# Patient Record
Sex: Male | Born: 1935 | Race: Black or African American | Hispanic: No
Health system: Southern US, Community
[De-identification: ages and names within clinical notes are randomized; demographics above are authoritative.]

---

## 2001-12-06 ENCOUNTER — Emergency Department (HOSPITAL_COMMUNITY): Admission: EM | Admit: 2001-12-06 | Discharge: 2001-12-06 | Payer: Self-pay | Admitting: Emergency Medicine

## 2001-12-06 ENCOUNTER — Encounter: Payer: Self-pay | Admitting: Emergency Medicine

## 2001-12-11 ENCOUNTER — Encounter: Payer: Self-pay | Admitting: Family Medicine

## 2001-12-11 ENCOUNTER — Inpatient Hospital Stay (HOSPITAL_COMMUNITY): Admission: AD | Admit: 2001-12-11 | Discharge: 2002-01-07 | Payer: Self-pay | Admitting: Family Medicine

## 2001-12-14 ENCOUNTER — Encounter: Payer: Self-pay | Admitting: Family Medicine

## 2001-12-15 ENCOUNTER — Encounter: Payer: Self-pay | Admitting: Infectious Diseases

## 2001-12-17 ENCOUNTER — Encounter: Payer: Self-pay | Admitting: Family Medicine

## 2001-12-18 ENCOUNTER — Encounter: Payer: Self-pay | Admitting: Family Medicine

## 2001-12-19 ENCOUNTER — Encounter: Payer: Self-pay | Admitting: Infectious Diseases

## 2001-12-20 ENCOUNTER — Encounter: Payer: Self-pay | Admitting: Family Medicine

## 2001-12-21 ENCOUNTER — Encounter: Payer: Self-pay | Admitting: Surgery

## 2001-12-22 ENCOUNTER — Encounter: Payer: Self-pay | Admitting: Surgery

## 2001-12-23 ENCOUNTER — Encounter: Payer: Self-pay | Admitting: Surgery

## 2001-12-24 ENCOUNTER — Encounter: Payer: Self-pay | Admitting: Surgery

## 2001-12-25 ENCOUNTER — Encounter: Payer: Self-pay | Admitting: Surgery

## 2001-12-25 ENCOUNTER — Encounter: Payer: Self-pay | Admitting: Family Medicine

## 2001-12-26 ENCOUNTER — Encounter: Payer: Self-pay | Admitting: Surgery

## 2001-12-27 ENCOUNTER — Encounter: Payer: Self-pay | Admitting: Family Medicine

## 2001-12-28 ENCOUNTER — Encounter: Payer: Self-pay | Admitting: Family Medicine

## 2001-12-29 ENCOUNTER — Encounter: Payer: Self-pay | Admitting: Family Medicine

## 2002-01-01 ENCOUNTER — Encounter: Payer: Self-pay | Admitting: Family Medicine

## 2002-01-05 ENCOUNTER — Encounter: Payer: Self-pay | Admitting: Family Medicine

## 2002-01-06 ENCOUNTER — Encounter: Payer: Self-pay | Admitting: Family Medicine

## 2002-01-26 ENCOUNTER — Encounter: Payer: Self-pay | Admitting: Surgery

## 2002-01-26 ENCOUNTER — Encounter: Admission: RE | Admit: 2002-01-26 | Discharge: 2002-01-26 | Payer: Self-pay | Admitting: Surgery

## 2005-11-28 ENCOUNTER — Encounter: Admission: RE | Admit: 2005-11-28 | Discharge: 2005-11-28 | Payer: Self-pay | Admitting: Family Medicine

## 2005-12-13 ENCOUNTER — Emergency Department (HOSPITAL_COMMUNITY): Admission: EM | Admit: 2005-12-13 | Discharge: 2005-12-13 | Payer: Self-pay | Admitting: Emergency Medicine

## 2005-12-17 ENCOUNTER — Inpatient Hospital Stay (HOSPITAL_COMMUNITY): Admission: EM | Admit: 2005-12-17 | Discharge: 2006-01-01 | Payer: Self-pay | Admitting: Emergency Medicine

## 2005-12-17 ENCOUNTER — Ambulatory Visit: Payer: Self-pay | Admitting: Infectious Diseases

## 2005-12-23 ENCOUNTER — Encounter (INDEPENDENT_AMBULATORY_CARE_PROVIDER_SITE_OTHER): Payer: Self-pay | Admitting: *Deleted

## 2005-12-24 ENCOUNTER — Encounter (INDEPENDENT_AMBULATORY_CARE_PROVIDER_SITE_OTHER): Payer: Self-pay | Admitting: *Deleted

## 2006-01-03 ENCOUNTER — Ambulatory Visit: Payer: Self-pay | Admitting: Gastroenterology

## 2006-01-03 ENCOUNTER — Emergency Department (HOSPITAL_COMMUNITY): Admission: EM | Admit: 2006-01-03 | Discharge: 2006-01-03 | Payer: Self-pay | Admitting: Emergency Medicine

## 2006-01-04 ENCOUNTER — Ambulatory Visit (HOSPITAL_COMMUNITY): Admission: RE | Admit: 2006-01-04 | Discharge: 2006-01-04 | Payer: Self-pay | Admitting: Diagnostic Radiology

## 2006-01-09 ENCOUNTER — Ambulatory Visit (HOSPITAL_COMMUNITY): Admission: RE | Admit: 2006-01-09 | Discharge: 2006-01-09 | Payer: Self-pay | Admitting: Internal Medicine

## 2006-01-16 ENCOUNTER — Ambulatory Visit (HOSPITAL_COMMUNITY): Admission: RE | Admit: 2006-01-16 | Discharge: 2006-01-16 | Payer: Self-pay | Admitting: Internal Medicine

## 2006-12-23 ENCOUNTER — Emergency Department (HOSPITAL_COMMUNITY): Admission: EM | Admit: 2006-12-23 | Discharge: 2006-12-24 | Payer: Self-pay | Admitting: Emergency Medicine

## 2007-07-28 ENCOUNTER — Emergency Department (HOSPITAL_COMMUNITY): Admission: EM | Admit: 2007-07-28 | Discharge: 2007-07-28 | Payer: Self-pay | Admitting: Pediatrics

## 2008-03-11 ENCOUNTER — Encounter: Admission: RE | Admit: 2008-03-11 | Discharge: 2008-03-11 | Payer: Self-pay | Admitting: General Surgery

## 2008-03-16 ENCOUNTER — Ambulatory Visit (HOSPITAL_BASED_OUTPATIENT_CLINIC_OR_DEPARTMENT_OTHER): Admission: RE | Admit: 2008-03-16 | Discharge: 2008-03-17 | Payer: Self-pay | Admitting: General Surgery

## 2009-06-19 ENCOUNTER — Inpatient Hospital Stay (HOSPITAL_COMMUNITY): Admission: EM | Admit: 2009-06-19 | Discharge: 2009-07-05 | Payer: Self-pay | Admitting: Emergency Medicine

## 2009-06-19 ENCOUNTER — Ambulatory Visit: Payer: Self-pay | Admitting: Cardiology

## 2009-06-20 ENCOUNTER — Encounter (INDEPENDENT_AMBULATORY_CARE_PROVIDER_SITE_OTHER): Payer: Self-pay | Admitting: Cardiology

## 2009-06-23 ENCOUNTER — Ambulatory Visit: Payer: Self-pay | Admitting: Thoracic Surgery (Cardiothoracic Vascular Surgery)

## 2009-07-05 ENCOUNTER — Inpatient Hospital Stay: Admission: RE | Admit: 2009-07-05 | Discharge: 2009-08-01 | Payer: Self-pay | Admitting: Internal Medicine

## 2009-07-06 ENCOUNTER — Encounter (INDEPENDENT_AMBULATORY_CARE_PROVIDER_SITE_OTHER): Payer: Self-pay | Admitting: Internal Medicine

## 2009-07-20 ENCOUNTER — Ambulatory Visit: Payer: Self-pay | Admitting: Internal Medicine

## 2009-07-27 ENCOUNTER — Ambulatory Visit: Payer: Self-pay | Admitting: Vascular Surgery

## 2009-07-27 ENCOUNTER — Encounter (INDEPENDENT_AMBULATORY_CARE_PROVIDER_SITE_OTHER): Payer: Self-pay | Admitting: Internal Medicine

## 2009-08-25 DEATH — deceased

## 2010-08-12 LAB — MRSA PCR SCREENING: MRSA by PCR: NEGATIVE

## 2010-08-12 LAB — COMPREHENSIVE METABOLIC PANEL
ALT: 39 U/L (ref 0–53)
AST: 101 U/L — ABNORMAL HIGH (ref 0–37)
AST: 84 U/L — ABNORMAL HIGH (ref 0–37)
Albumin: 2.4 g/dL — ABNORMAL LOW (ref 3.5–5.2)
Alkaline Phosphatase: 43 U/L (ref 39–117)
Alkaline Phosphatase: 94 U/L (ref 39–117)
Chloride: 112 mEq/L (ref 96–112)
GFR calc Af Amer: 59 mL/min — ABNORMAL LOW (ref >60)
GFR calc Af Amer: >60 mL/min (ref >60)
Glucose, Bld: 205 mg/dL — ABNORMAL HIGH (ref 70–99)
Potassium: 3.4 mEq/L — ABNORMAL LOW (ref 3.5–5.1)
Potassium: 4.2 mEq/L (ref 3.5–5.1)
Sodium: 138 mEq/L (ref 135–145)
Sodium: 138 mEq/L (ref 135–145)
Total Bilirubin: 2 mg/dL — ABNORMAL HIGH (ref 0.3–1.2)
Total Protein: 6.5 g/dL (ref 6.0–8.3)

## 2010-08-12 LAB — POCT I-STAT 4, (NA,K, GLUC, HGB,HCT)
Glucose, Bld: 111 mg/dL — ABNORMAL HIGH (ref 70–99)
Glucose, Bld: 127 mg/dL — ABNORMAL HIGH (ref 70–99)
Glucose, Bld: 135 mg/dL — ABNORMAL HIGH (ref 70–99)
Glucose, Bld: 153 mg/dL — ABNORMAL HIGH (ref 70–99)
HCT: 26 % — ABNORMAL LOW (ref 39.0–52.0)
HCT: 28 % — ABNORMAL LOW (ref 39.0–52.0)
HCT: 37 % — ABNORMAL LOW (ref 39.0–52.0)
Hemoglobin: 12.6 g/dL — ABNORMAL LOW (ref 13.0–17.0)
Hemoglobin: 8.8 g/dL — ABNORMAL LOW (ref 13.0–17.0)
Hemoglobin: 8.8 g/dL — ABNORMAL LOW (ref 13.0–17.0)
Potassium: 4 mEq/L (ref 3.5–5.1)
Potassium: 4.2 mEq/L (ref 3.5–5.1)
Potassium: 4.9 mEq/L (ref 3.5–5.1)
Sodium: 131 mEq/L — ABNORMAL LOW (ref 135–145)
Sodium: 131 mEq/L — ABNORMAL LOW (ref 135–145)
Sodium: 137 mEq/L (ref 135–145)

## 2010-08-12 LAB — DIFFERENTIAL
Basophils Relative: 0 % (ref 0–1)
Basophils Relative: 0 % (ref 0–1)
Eosinophils Absolute: 0 10*3/uL (ref 0.0–0.7)
Eosinophils Absolute: 0 10*3/uL (ref 0.0–0.7)
Eosinophils Relative: 1 % (ref 0–5)
Lymphs Abs: 1.9 10*3/uL (ref 0.7–4.0)
Neutrophils Relative %: 82 % — ABNORMAL HIGH (ref 43–77)

## 2010-08-12 LAB — POCT I-STAT 3, ART BLOOD GAS (G3+)
Acid-base deficit: 3 mmol/L — ABNORMAL HIGH (ref 0.0–2.0)
Acid-base deficit: 4 mmol/L — ABNORMAL HIGH (ref 0.0–2.0)
Acid-base deficit: 4 mmol/L — ABNORMAL HIGH (ref 0.0–2.0)
Acid-base deficit: 6 mmol/L — ABNORMAL HIGH (ref 0.0–2.0)
Bicarbonate: 19.8 mEq/L — ABNORMAL LOW (ref 20.0–24.0)
O2 Saturation: 100 %
O2 Saturation: 92 %
O2 Saturation: 96 %
TCO2: 22 mmol/L (ref 0–100)
TCO2: 23 mmol/L (ref 0–100)
pCO2 arterial: 30.8 mmHg — ABNORMAL LOW (ref 35.0–45.0)
pCO2 arterial: 33.5 mmHg — ABNORMAL LOW (ref 35.0–45.0)
pCO2 arterial: 39.5 mmHg (ref 35.0–45.0)
pCO2 arterial: 40.5 mmHg (ref 35.0–45.0)
pH, Arterial: 7.339 — ABNORMAL LOW (ref 7.350–7.450)
pH, Arterial: 7.369 (ref 7.350–7.450)
pH, Arterial: 7.4 (ref 7.350–7.450)
pO2, Arterial: 64 mmHg — ABNORMAL LOW (ref 80.0–100.0)
pO2, Arterial: 68 mmHg — ABNORMAL LOW (ref 80.0–100.0)
pO2, Arterial: 75 mmHg — ABNORMAL LOW (ref 80.0–100.0)

## 2010-08-12 LAB — CBC
HCT: 17.6 % — ABNORMAL LOW (ref 39.0–52.0)
HCT: 24.1 % — ABNORMAL LOW (ref 39.0–52.0)
HCT: 24.2 % — ABNORMAL LOW (ref 39.0–52.0)
HCT: 26.4 % — ABNORMAL LOW (ref 39.0–52.0)
HCT: 33.1 % — ABNORMAL LOW (ref 39.0–52.0)
HCT: 38.7 % — ABNORMAL LOW (ref 39.0–52.0)
HCT: 39.1 % (ref 39.0–52.0)
HCT: 40.1 % (ref 39.0–52.0)
Hemoglobin: 10.8 g/dL — ABNORMAL LOW (ref 13.0–17.0)
Hemoglobin: 12.8 g/dL — ABNORMAL LOW (ref 13.0–17.0)
Hemoglobin: 13.3 g/dL (ref 13.0–17.0)
Hemoglobin: 14.2 g/dL (ref 13.0–17.0)
Hemoglobin: 14.4 g/dL (ref 13.0–17.0)
Hemoglobin: 5.9 g/dL — CL (ref 13.0–17.0)
Hemoglobin: 8.1 g/dL — ABNORMAL LOW (ref 13.0–17.0)
Hemoglobin: 8.1 g/dL — ABNORMAL LOW (ref 13.0–17.0)
MCHC: 32.7 g/dL (ref 30.0–36.0)
MCHC: 32.8 g/dL (ref 30.0–36.0)
MCHC: 33 g/dL (ref 30.0–36.0)
MCHC: 33.3 g/dL (ref 30.0–36.0)
MCHC: 33.4 g/dL (ref 30.0–36.0)
MCHC: 33.9 g/dL (ref 30.0–36.0)
MCHC: 34 g/dL (ref 30.0–36.0)
MCHC: 34.4 g/dL (ref 30.0–36.0)
MCV: 89.8 fL (ref 78.0–100.0)
MCV: 90.3 fL (ref 78.0–100.0)
MCV: 90.4 fL (ref 78.0–100.0)
MCV: 90.9 fL (ref 78.0–100.0)
MCV: 90.9 fL (ref 78.0–100.0)
MCV: 91.7 fL (ref 78.0–100.0)
MCV: 92.2 fL (ref 78.0–100.0)
MCV: 92.4 fL (ref 78.0–100.0)
MCV: 92.5 fL (ref 78.0–100.0)
Platelets: 117 10*3/uL — ABNORMAL LOW (ref 150–400)
Platelets: 190 10*3/uL (ref 150–400)
Platelets: 196 10*3/uL (ref 150–400)
Platelets: 215 10*3/uL (ref 150–400)
Platelets: 77 10*3/uL — ABNORMAL LOW (ref 150–400)
Platelets: 82 10*3/uL — ABNORMAL LOW (ref 150–400)
Platelets: 90 10*3/uL — ABNORMAL LOW (ref 150–400)
RBC: 2.69 MIL/uL — ABNORMAL LOW (ref 4.22–5.81)
RBC: 3.58 MIL/uL — ABNORMAL LOW (ref 4.22–5.81)
RBC: 4.42 MIL/uL (ref 4.22–5.81)
RBC: 4.59 MIL/uL (ref 4.22–5.81)
RBC: 4.59 MIL/uL (ref 4.22–5.81)
RDW: 13.3 % (ref 11.5–15.5)
RDW: 13.4 % (ref 11.5–15.5)
RDW: 13.6 % (ref 11.5–15.5)
RDW: 13.7 % (ref 11.5–15.5)
RDW: 13.8 % (ref 11.5–15.5)
RDW: 14.9 % (ref 11.5–15.5)
RDW: 15.1 % (ref 11.5–15.5)
WBC: 10.8 10*3/uL — ABNORMAL HIGH (ref 4.0–10.5)
WBC: 11.8 10*3/uL — ABNORMAL HIGH (ref 4.0–10.5)
WBC: 15.1 10*3/uL — ABNORMAL HIGH (ref 4.0–10.5)
WBC: 7.1 10*3/uL (ref 4.0–10.5)
WBC: 7.4 10*3/uL (ref 4.0–10.5)
WBC: 7.5 10*3/uL (ref 4.0–10.5)
WBC: 8.5 10*3/uL (ref 4.0–10.5)
WBC: 9.2 10*3/uL (ref 4.0–10.5)

## 2010-08-12 LAB — FOLATE: Folate: 7.8 ng/mL

## 2010-08-12 LAB — GLUCOSE, CAPILLARY
Glucose-Capillary: 103 mg/dL — ABNORMAL HIGH (ref 70–99)
Glucose-Capillary: 127 mg/dL — ABNORMAL HIGH (ref 70–99)
Glucose-Capillary: 131 mg/dL — ABNORMAL HIGH (ref 70–99)
Glucose-Capillary: 136 mg/dL — ABNORMAL HIGH (ref 70–99)
Glucose-Capillary: 138 mg/dL — ABNORMAL HIGH (ref 70–99)
Glucose-Capillary: 141 mg/dL — ABNORMAL HIGH (ref 70–99)
Glucose-Capillary: 145 mg/dL — ABNORMAL HIGH (ref 70–99)
Glucose-Capillary: 155 mg/dL — ABNORMAL HIGH (ref 70–99)
Glucose-Capillary: 159 mg/dL — ABNORMAL HIGH (ref 70–99)
Glucose-Capillary: 99 mg/dL (ref 70–99)

## 2010-08-12 LAB — POCT I-STAT, CHEM 8
BUN: 24 mg/dL — ABNORMAL HIGH (ref 6–23)
Calcium, Ion: 1.57 mmol/L — ABNORMAL HIGH (ref 1.12–1.32)
Calcium, Ion: 1.61 mmol/L — ABNORMAL HIGH (ref 1.12–1.32)
Chloride: 105 mEq/L (ref 96–112)
Chloride: 111 mEq/L (ref 96–112)
Chloride: 112 mEq/L (ref 96–112)
Glucose, Bld: 109 mg/dL — ABNORMAL HIGH (ref 70–99)
Glucose, Bld: 115 mg/dL — ABNORMAL HIGH (ref 70–99)
HCT: 15 % — ABNORMAL LOW (ref 39.0–52.0)
HCT: 16 % — ABNORMAL LOW (ref 39.0–52.0)
HCT: 24 % — ABNORMAL LOW (ref 39.0–52.0)
HCT: 25 % — ABNORMAL LOW (ref 39.0–52.0)
Hemoglobin: 5.1 g/dL — CL (ref 13.0–17.0)
Hemoglobin: 5.4 g/dL — CL (ref 13.0–17.0)
Potassium: 3.4 mEq/L — ABNORMAL LOW (ref 3.5–5.1)
Potassium: 4.2 mEq/L (ref 3.5–5.1)
Sodium: 137 mEq/L (ref 135–145)

## 2010-08-12 LAB — PREPARE RBC (CROSSMATCH)

## 2010-08-12 LAB — BASIC METABOLIC PANEL
BUN: 14 mg/dL (ref 6–23)
BUN: 15 mg/dL (ref 6–23)
BUN: 16 mg/dL (ref 6–23)
BUN: 19 mg/dL (ref 6–23)
BUN: 19 mg/dL (ref 6–23)
BUN: 21 mg/dL (ref 6–23)
CO2: 21 mEq/L (ref 19–32)
CO2: 23 mEq/L (ref 19–32)
CO2: 25 mEq/L (ref 19–32)
CO2: 25 mEq/L (ref 19–32)
CO2: 26 mEq/L (ref 19–32)
Calcium: 10.1 mg/dL (ref 8.4–10.5)
Calcium: 10.9 mg/dL — ABNORMAL HIGH (ref 8.4–10.5)
Chloride: 107 mEq/L (ref 96–112)
Chloride: 107 mEq/L (ref 96–112)
Chloride: 108 mEq/L (ref 96–112)
Chloride: 109 mEq/L (ref 96–112)
Chloride: 110 mEq/L (ref 96–112)
Chloride: 113 mEq/L — ABNORMAL HIGH (ref 96–112)
Creatinine, Ser: 1.01 mg/dL (ref 0.4–1.5)
Creatinine, Ser: 1.12 mg/dL (ref 0.4–1.5)
Creatinine, Ser: 1.16 mg/dL (ref 0.4–1.5)
Creatinine, Ser: 1.2 mg/dL (ref 0.4–1.5)
Creatinine, Ser: 1.21 mg/dL (ref 0.4–1.5)
GFR calc Af Amer: >60 mL/min (ref >60)
GFR calc Af Amer: >60 mL/min (ref >60)
GFR calc non Af Amer: 49 mL/min — ABNORMAL LOW (ref >60)
GFR calc non Af Amer: 58 mL/min — ABNORMAL LOW (ref >60)
GFR calc non Af Amer: >60 mL/min (ref >60)
Glucose, Bld: 146 mg/dL — ABNORMAL HIGH (ref 70–99)
Glucose, Bld: 192 mg/dL — ABNORMAL HIGH (ref 70–99)
Glucose, Bld: 282 mg/dL — ABNORMAL HIGH (ref 70–99)
Glucose, Bld: 81 mg/dL (ref 70–99)
Potassium: 3.8 mEq/L (ref 3.5–5.1)
Potassium: 4 mEq/L (ref 3.5–5.1)
Potassium: 4 mEq/L (ref 3.5–5.1)
Sodium: 140 mEq/L (ref 135–145)

## 2010-08-12 LAB — CARDIAC PANEL(CRET KIN+CKTOT+MB+TROPI)
CK, MB: 136.3 ng/mL (ref 0.3–4.0)
CK, MB: 58 ng/mL (ref 0.3–4.0)
Relative Index: 11 — ABNORMAL HIGH (ref 0.0–2.5)
Relative Index: 15.4 — ABNORMAL HIGH (ref 0.0–2.5)
Relative Index: 15.9 — ABNORMAL HIGH (ref 0.0–2.5)
Total CK: 856 U/L — ABNORMAL HIGH (ref 7–232)
Troponin I: 30.82 ng/mL (ref 0.00–0.06)

## 2010-08-12 LAB — BLOOD GAS, ARTERIAL
Acid-base deficit: 4 mmol/L — ABNORMAL HIGH (ref 0.0–2.0)
Bicarbonate: 19.8 mEq/L — ABNORMAL LOW (ref 20.0–24.0)
Patient temperature: 98.6
TCO2: 20.8 mmol/L (ref 0–100)

## 2010-08-12 LAB — HEMOGLOBIN AND HEMATOCRIT, BLOOD
HCT: 26 % — ABNORMAL LOW (ref 39.0–52.0)
Hemoglobin: 8.6 g/dL — ABNORMAL LOW (ref 13.0–17.0)

## 2010-08-12 LAB — RETICULOCYTES
RBC.: 4.27 MIL/uL (ref 4.22–5.81)
Retic Count, Absolute: 98.2 10*3/uL (ref 19.0–186.0)
Retic Ct Pct: 2.3 % (ref 0.4–3.1)

## 2010-08-12 LAB — CROSSMATCH

## 2010-08-12 LAB — POCT CARDIAC MARKERS: Myoglobin, poc: 234 ng/mL (ref 12–200)

## 2010-08-12 LAB — APTT: aPTT: >200 seconds (ref 24–37)

## 2010-08-12 LAB — MAGNESIUM
Magnesium: 2.1 mg/dL (ref 1.5–2.5)
Magnesium: 2.2 mg/dL (ref 1.5–2.5)
Magnesium: 2.2 mg/dL (ref 1.5–2.5)
Magnesium: 2.2 mg/dL (ref 1.5–2.5)
Magnesium: 2.5 mg/dL (ref 1.5–2.5)

## 2010-08-12 LAB — TROPONIN I
Troponin I: 0.78 ng/mL (ref 0.00–0.06)
Troponin I: 4.42 ng/mL (ref 0.00–0.06)

## 2010-08-12 LAB — CREATININE, SERUM
Creatinine, Ser: 1.11 mg/dL (ref 0.4–1.5)
GFR calc non Af Amer: 57 mL/min — ABNORMAL LOW (ref >60)
GFR calc non Af Amer: >60 mL/min (ref >60)

## 2010-08-12 LAB — POCT I-STAT 3, VENOUS BLOOD GAS (G3P V)
TCO2: 21 mmol/L (ref 0–100)
pCO2, Ven: 41.4 mmHg — ABNORMAL LOW (ref 45.0–50.0)
pH, Ven: 7.297 (ref 7.250–7.300)

## 2010-08-12 LAB — PROTIME-INR: Prothrombin Time: 14.3 seconds (ref 11.6–15.2)

## 2010-08-12 LAB — CK TOTAL AND CKMB (NOT AT ARMC)
CK, MB: 21.5 ng/mL (ref 0.3–4.0)
CK, MB: 66.9 ng/mL (ref 0.3–4.0)
Total CK: 335 U/L — ABNORMAL HIGH (ref 7–232)
Total CK: 355 U/L — ABNORMAL HIGH (ref 7–232)
Total CK: 371 U/L — ABNORMAL HIGH (ref 7–232)

## 2010-08-12 LAB — PREPARE FRESH FROZEN PLASMA

## 2010-08-12 LAB — HEPARIN LEVEL (UNFRACTIONATED): Heparin Unfractionated: 0.6 IU/mL (ref 0.30–0.70)

## 2010-08-12 LAB — LIPID PANEL: HDL: 42 mg/dL (ref >39)

## 2010-08-12 LAB — CARBOXYHEMOGLOBIN: O2 Saturation: 54.4 %

## 2010-08-12 LAB — PLATELET COUNT: Platelets: 116 10*3/uL — ABNORMAL LOW (ref 150–400)

## 2010-08-12 LAB — RPR: RPR Ser Ql: NONREACTIVE

## 2010-08-12 LAB — PREPARE PLATELETS

## 2010-08-15 LAB — COMPREHENSIVE METABOLIC PANEL
ALT: 58 U/L — ABNORMAL HIGH (ref 0–53)
AST: 28 U/L (ref 0–37)
AST: 35 U/L (ref 0–37)
Albumin: 2.2 g/dL — ABNORMAL LOW (ref 3.5–5.2)
Albumin: 2.3 g/dL — ABNORMAL LOW (ref 3.5–5.2)
Alkaline Phosphatase: 101 U/L (ref 39–117)
Alkaline Phosphatase: 115 U/L (ref 39–117)
Alkaline Phosphatase: 143 U/L — ABNORMAL HIGH (ref 39–117)
BUN: 19 mg/dL (ref 6–23)
BUN: 20 mg/dL (ref 6–23)
BUN: 24 mg/dL — ABNORMAL HIGH (ref 6–23)
BUN: 29 mg/dL — ABNORMAL HIGH (ref 6–23)
CO2: 25 mEq/L (ref 19–32)
CO2: 26 mEq/L (ref 19–32)
Calcium: 10 mg/dL (ref 8.4–10.5)
Calcium: 10.3 mg/dL (ref 8.4–10.5)
Calcium: 11.8 mg/dL — ABNORMAL HIGH (ref 8.4–10.5)
Chloride: 116 mEq/L — ABNORMAL HIGH (ref 96–112)
Chloride: 120 mEq/L — ABNORMAL HIGH (ref 96–112)
Creatinine, Ser: 0.97 mg/dL (ref 0.4–1.5)
Creatinine, Ser: 1.1 mg/dL (ref 0.4–1.5)
GFR calc Af Amer: >60 mL/min (ref >60)
GFR calc Af Amer: >60 mL/min (ref >60)
GFR calc non Af Amer: >60 mL/min (ref >60)
GFR calc non Af Amer: >60 mL/min (ref >60)
Glucose, Bld: 153 mg/dL — ABNORMAL HIGH (ref 70–99)
Glucose, Bld: 157 mg/dL — ABNORMAL HIGH (ref 70–99)
Glucose, Bld: 95 mg/dL (ref 70–99)
Potassium: 3.4 mEq/L — ABNORMAL LOW (ref 3.5–5.1)
Potassium: 3.7 mEq/L (ref 3.5–5.1)
Sodium: 144 mEq/L (ref 135–145)
Total Bilirubin: 1.5 mg/dL — ABNORMAL HIGH (ref 0.3–1.2)
Total Protein: 5.9 g/dL — ABNORMAL LOW (ref 6.0–8.3)
Total Protein: 5.9 g/dL — ABNORMAL LOW (ref 6.0–8.3)
Total Protein: 5.9 g/dL — ABNORMAL LOW (ref 6.0–8.3)

## 2010-08-15 LAB — DIFFERENTIAL
Basophils Absolute: 0 10*3/uL (ref 0.0–0.1)
Basophils Absolute: 0 10*3/uL (ref 0.0–0.1)
Basophils Relative: 0 % (ref 0–1)
Basophils Relative: 0 % (ref 0–1)
Basophils Relative: 0 % (ref 0–1)
Eosinophils Absolute: 0.1 10*3/uL (ref 0.0–0.7)
Eosinophils Absolute: 0.1 10*3/uL (ref 0.0–0.7)
Eosinophils Absolute: 0.1 10*3/uL (ref 0.0–0.7)
Eosinophils Relative: 0 % (ref 0–5)
Eosinophils Relative: 1 % (ref 0–5)
Eosinophils Relative: 1 % (ref 0–5)
Eosinophils Relative: 1 % (ref 0–5)
Lymphocytes Relative: 5 % — ABNORMAL LOW (ref 12–46)
Lymphocytes Relative: 6 % — ABNORMAL LOW (ref 12–46)
Lymphocytes Relative: 7 % — ABNORMAL LOW (ref 12–46)
Lymphs Abs: 0.6 10*3/uL — ABNORMAL LOW (ref 0.7–4.0)
Lymphs Abs: 0.7 10*3/uL (ref 0.7–4.0)
Lymphs Abs: 0.8 10*3/uL (ref 0.7–4.0)
Monocytes Absolute: 0.3 10*3/uL (ref 0.1–1.0)
Monocytes Absolute: 0.5 10*3/uL (ref 0.1–1.0)
Monocytes Absolute: 0.5 10*3/uL (ref 0.1–1.0)
Monocytes Absolute: 1.2 10*3/uL — ABNORMAL HIGH (ref 0.1–1.0)
Monocytes Relative: 4 % (ref 3–12)
Monocytes Relative: 5 % (ref 3–12)
Monocytes Relative: 5 % (ref 3–12)
Neutro Abs: 11.2 10*3/uL — ABNORMAL HIGH (ref 1.7–7.7)
Neutro Abs: 12.2 10*3/uL — ABNORMAL HIGH (ref 1.7–7.7)
Neutro Abs: 17.9 10*3/uL — ABNORMAL HIGH (ref 1.7–7.7)
Neutrophils Relative %: 86 % — ABNORMAL HIGH (ref 43–77)
Neutrophils Relative %: 87 % — ABNORMAL HIGH (ref 43–77)
Neutrophils Relative %: 87 % — ABNORMAL HIGH (ref 43–77)
Neutrophils Relative %: 90 % — ABNORMAL HIGH (ref 43–77)
Neutrophils Relative %: 91 % — ABNORMAL HIGH (ref 43–77)

## 2010-08-15 LAB — URINE CULTURE

## 2010-08-15 LAB — GLUCOSE, CAPILLARY
Glucose-Capillary: 101 mg/dL — ABNORMAL HIGH (ref 70–99)
Glucose-Capillary: 102 mg/dL — ABNORMAL HIGH (ref 70–99)
Glucose-Capillary: 104 mg/dL — ABNORMAL HIGH (ref 70–99)
Glucose-Capillary: 105 mg/dL — ABNORMAL HIGH (ref 70–99)
Glucose-Capillary: 108 mg/dL — ABNORMAL HIGH (ref 70–99)
Glucose-Capillary: 113 mg/dL — ABNORMAL HIGH (ref 70–99)
Glucose-Capillary: 113 mg/dL — ABNORMAL HIGH (ref 70–99)
Glucose-Capillary: 117 mg/dL — ABNORMAL HIGH (ref 70–99)
Glucose-Capillary: 125 mg/dL — ABNORMAL HIGH (ref 70–99)
Glucose-Capillary: 128 mg/dL — ABNORMAL HIGH (ref 70–99)
Glucose-Capillary: 79 mg/dL (ref 70–99)
Glucose-Capillary: 86 mg/dL (ref 70–99)
Glucose-Capillary: 90 mg/dL (ref 70–99)
Glucose-Capillary: 91 mg/dL (ref 70–99)
Glucose-Capillary: 91 mg/dL (ref 70–99)
Glucose-Capillary: 95 mg/dL (ref 70–99)
Glucose-Capillary: 99 mg/dL (ref 70–99)

## 2010-08-15 LAB — BASIC METABOLIC PANEL
BUN: 23 mg/dL (ref 6–23)
BUN: 26 mg/dL — ABNORMAL HIGH (ref 6–23)
BUN: 28 mg/dL — ABNORMAL HIGH (ref 6–23)
BUN: 29 mg/dL — ABNORMAL HIGH (ref 6–23)
BUN: 31 mg/dL — ABNORMAL HIGH (ref 6–23)
CO2: 23 mEq/L (ref 19–32)
CO2: 24 mEq/L (ref 19–32)
CO2: 26 mEq/L (ref 19–32)
CO2: 26 mEq/L (ref 19–32)
CO2: 26 mEq/L (ref 19–32)
CO2: 26 mEq/L (ref 19–32)
CO2: 26 mEq/L (ref 19–32)
CO2: 27 mEq/L (ref 19–32)
CO2: 27 mEq/L (ref 19–32)
Calcium: 10 mg/dL (ref 8.4–10.5)
Calcium: 11 mg/dL — ABNORMAL HIGH (ref 8.4–10.5)
Calcium: 11 mg/dL — ABNORMAL HIGH (ref 8.4–10.5)
Calcium: 9.6 mg/dL (ref 8.4–10.5)
Calcium: 9.8 mg/dL (ref 8.4–10.5)
Chloride: 106 mEq/L (ref 96–112)
Chloride: 107 mEq/L (ref 96–112)
Chloride: 112 mEq/L (ref 96–112)
Chloride: 116 mEq/L — ABNORMAL HIGH (ref 96–112)
Chloride: 116 mEq/L — ABNORMAL HIGH (ref 96–112)
Chloride: 118 mEq/L — ABNORMAL HIGH (ref 96–112)
Chloride: 120 mEq/L — ABNORMAL HIGH (ref 96–112)
Chloride: 121 mEq/L — ABNORMAL HIGH (ref 96–112)
Chloride: 125 mEq/L — ABNORMAL HIGH (ref 96–112)
Creatinine, Ser: 0.84 mg/dL (ref 0.4–1.5)
Creatinine, Ser: 0.95 mg/dL (ref 0.4–1.5)
Creatinine, Ser: 1 mg/dL (ref 0.4–1.5)
Creatinine, Ser: 1.02 mg/dL (ref 0.4–1.5)
Creatinine, Ser: 1.08 mg/dL (ref 0.4–1.5)
Creatinine, Ser: 1.16 mg/dL (ref 0.4–1.5)
Creatinine, Ser: 1.22 mg/dL (ref 0.4–1.5)
Creatinine, Ser: 1.23 mg/dL (ref 0.4–1.5)
Creatinine, Ser: 1.24 mg/dL (ref 0.4–1.5)
GFR calc Af Amer: >60 mL/min (ref >60)
GFR calc Af Amer: >60 mL/min (ref >60)
GFR calc Af Amer: >60 mL/min (ref >60)
GFR calc Af Amer: >60 mL/min (ref >60)
GFR calc Af Amer: >60 mL/min (ref >60)
GFR calc Af Amer: >60 mL/min (ref >60)
GFR calc Af Amer: >60 mL/min (ref >60)
GFR calc Af Amer: >60 mL/min (ref >60)
GFR calc Af Amer: >60 mL/min (ref >60)
GFR calc Af Amer: >60 mL/min (ref >60)
GFR calc non Af Amer: 57 mL/min — ABNORMAL LOW (ref >60)
GFR calc non Af Amer: >60 mL/min (ref >60)
GFR calc non Af Amer: >60 mL/min (ref >60)
GFR calc non Af Amer: >60 mL/min (ref >60)
GFR calc non Af Amer: >60 mL/min (ref >60)
GFR calc non Af Amer: >60 mL/min (ref >60)
GFR calc non Af Amer: >60 mL/min (ref >60)
Glucose, Bld: 102 mg/dL — ABNORMAL HIGH (ref 70–99)
Glucose, Bld: 118 mg/dL — ABNORMAL HIGH (ref 70–99)
Glucose, Bld: 124 mg/dL — ABNORMAL HIGH (ref 70–99)
Glucose, Bld: 154 mg/dL — ABNORMAL HIGH (ref 70–99)
Glucose, Bld: 94 mg/dL (ref 70–99)
Potassium: 3.1 mEq/L — ABNORMAL LOW (ref 3.5–5.1)
Potassium: 3.5 mEq/L (ref 3.5–5.1)
Potassium: 3.6 mEq/L (ref 3.5–5.1)
Potassium: 3.7 mEq/L (ref 3.5–5.1)
Potassium: 3.7 mEq/L (ref 3.5–5.1)
Sodium: 136 mEq/L (ref 135–145)
Sodium: 137 mEq/L (ref 135–145)
Sodium: 142 mEq/L (ref 135–145)
Sodium: 144 mEq/L (ref 135–145)
Sodium: 145 mEq/L (ref 135–145)
Sodium: 147 mEq/L — ABNORMAL HIGH (ref 135–145)
Sodium: 149 mEq/L — ABNORMAL HIGH (ref 135–145)

## 2010-08-15 LAB — PREALBUMIN
Prealbumin: 10 mg/dL — ABNORMAL LOW (ref 18.0–45.0)
Prealbumin: 9.2 mg/dL — ABNORMAL LOW (ref 18.0–45.0)

## 2010-08-15 LAB — WOUND CULTURE: Gram Stain: NONE SEEN

## 2010-08-15 LAB — HEPARIN LEVEL (UNFRACTIONATED): Heparin Unfractionated: <0.1 IU/mL — ABNORMAL LOW (ref 0.30–0.70)

## 2010-08-15 LAB — CBC
HCT: 24.3 % — ABNORMAL LOW (ref 39.0–52.0)
HCT: 24.4 % — ABNORMAL LOW (ref 39.0–52.0)
HCT: 25.2 % — ABNORMAL LOW (ref 39.0–52.0)
HCT: 25.7 % — ABNORMAL LOW (ref 39.0–52.0)
HCT: 25.8 % — ABNORMAL LOW (ref 39.0–52.0)
HCT: 26 % — ABNORMAL LOW (ref 39.0–52.0)
HCT: 26.5 % — ABNORMAL LOW (ref 39.0–52.0)
HCT: 27.3 % — ABNORMAL LOW (ref 39.0–52.0)
HCT: 28.7 % — ABNORMAL LOW (ref 39.0–52.0)
HCT: 29.7 % — ABNORMAL LOW (ref 39.0–52.0)
HCT: 34.7 % — ABNORMAL LOW (ref 39.0–52.0)
Hemoglobin: 8.2 g/dL — ABNORMAL LOW (ref 13.0–17.0)
Hemoglobin: 8.5 g/dL — ABNORMAL LOW (ref 13.0–17.0)
Hemoglobin: 8.7 g/dL — ABNORMAL LOW (ref 13.0–17.0)
Hemoglobin: 8.8 g/dL — ABNORMAL LOW (ref 13.0–17.0)
Hemoglobin: 9 g/dL — ABNORMAL LOW (ref 13.0–17.0)
Hemoglobin: 9.1 g/dL — ABNORMAL LOW (ref 13.0–17.0)
Hemoglobin: 9.6 g/dL — ABNORMAL LOW (ref 13.0–17.0)
MCHC: 32.5 g/dL (ref 30.0–36.0)
MCHC: 32.5 g/dL (ref 30.0–36.0)
MCHC: 32.6 g/dL (ref 30.0–36.0)
MCHC: 32.6 g/dL (ref 30.0–36.0)
MCHC: 32.7 g/dL (ref 30.0–36.0)
MCHC: 33 g/dL (ref 30.0–36.0)
MCHC: 33.1 g/dL (ref 30.0–36.0)
MCHC: 33.2 g/dL (ref 30.0–36.0)
MCHC: 33.4 g/dL (ref 30.0–36.0)
MCHC: 33.5 g/dL (ref 30.0–36.0)
MCHC: 33.6 g/dL (ref 30.0–36.0)
MCHC: 33.9 g/dL (ref 30.0–36.0)
MCV: 89.7 fL (ref 78.0–100.0)
MCV: 89.7 fL (ref 78.0–100.0)
MCV: 90.9 fL (ref 78.0–100.0)
MCV: 91.4 fL (ref 78.0–100.0)
MCV: 91.4 fL (ref 78.0–100.0)
MCV: 92.1 fL (ref 78.0–100.0)
MCV: 92.4 fL (ref 78.0–100.0)
MCV: 93.6 fL (ref 78.0–100.0)
MCV: 93.8 fL (ref 78.0–100.0)
MCV: 95.3 fL (ref 78.0–100.0)
Platelets: 116 10*3/uL — ABNORMAL LOW (ref 150–400)
Platelets: 129 10*3/uL — ABNORMAL LOW (ref 150–400)
Platelets: 133 10*3/uL — ABNORMAL LOW (ref 150–400)
Platelets: 162 10*3/uL (ref 150–400)
Platelets: 165 10*3/uL (ref 150–400)
Platelets: 176 10*3/uL (ref 150–400)
Platelets: 177 10*3/uL (ref 150–400)
Platelets: 187 10*3/uL (ref 150–400)
Platelets: 196 10*3/uL (ref 150–400)
RBC: 2.67 MIL/uL — ABNORMAL LOW (ref 4.22–5.81)
RBC: 2.72 MIL/uL — ABNORMAL LOW (ref 4.22–5.81)
RBC: 2.77 MIL/uL — ABNORMAL LOW (ref 4.22–5.81)
RBC: 2.82 MIL/uL — ABNORMAL LOW (ref 4.22–5.81)
RBC: 2.91 MIL/uL — ABNORMAL LOW (ref 4.22–5.81)
RBC: 2.94 MIL/uL — ABNORMAL LOW (ref 4.22–5.81)
RBC: 2.96 MIL/uL — ABNORMAL LOW (ref 4.22–5.81)
RBC: 2.98 MIL/uL — ABNORMAL LOW (ref 4.22–5.81)
RBC: 2.99 MIL/uL — ABNORMAL LOW (ref 4.22–5.81)
RBC: 3.06 MIL/uL — ABNORMAL LOW (ref 4.22–5.81)
RBC: 3.12 MIL/uL — ABNORMAL LOW (ref 4.22–5.81)
RDW: 14.9 % (ref 11.5–15.5)
RDW: 15.6 % — ABNORMAL HIGH (ref 11.5–15.5)
RDW: 15.7 % — ABNORMAL HIGH (ref 11.5–15.5)
RDW: 16.4 % — ABNORMAL HIGH (ref 11.5–15.5)
RDW: 21 % — ABNORMAL HIGH (ref 11.5–15.5)
RDW: 21.1 % — ABNORMAL HIGH (ref 11.5–15.5)
RDW: 21.4 % — ABNORMAL HIGH (ref 11.5–15.5)
WBC: 12.2 10*3/uL — ABNORMAL HIGH (ref 4.0–10.5)
WBC: 12.6 10*3/uL — ABNORMAL HIGH (ref 4.0–10.5)
WBC: 19.9 10*3/uL — ABNORMAL HIGH (ref 4.0–10.5)
WBC: 20 10*3/uL — ABNORMAL HIGH (ref 4.0–10.5)
WBC: 20.2 10*3/uL — ABNORMAL HIGH (ref 4.0–10.5)
WBC: 5.9 10*3/uL (ref 4.0–10.5)
WBC: 7.7 10*3/uL (ref 4.0–10.5)
WBC: 8.2 10*3/uL (ref 4.0–10.5)

## 2010-08-15 LAB — PROTIME-INR
INR: 1.14 (ref 0.00–1.49)
INR: 1.22 (ref 0.00–1.49)
Prothrombin Time: 14.5 seconds (ref 11.6–15.2)
Prothrombin Time: 15.3 seconds — ABNORMAL HIGH (ref 11.6–15.2)

## 2010-08-15 LAB — CATH TIP CULTURE: Culture: 6

## 2010-08-15 LAB — PHOSPHORUS
Phosphorus: 2.2 mg/dL — ABNORMAL LOW (ref 2.3–4.6)
Phosphorus: 3 mg/dL (ref 2.3–4.6)

## 2010-08-15 LAB — CULTURE, BLOOD (ROUTINE X 2): Culture: NO GROWTH

## 2010-08-15 LAB — POCT I-STAT, CHEM 8
Calcium, Ion: 1.6 mmol/L — ABNORMAL HIGH (ref 1.12–1.32)
Chloride: 115 mEq/L — ABNORMAL HIGH (ref 96–112)
Creatinine, Ser: 1.3 mg/dL (ref 0.4–1.5)
Glucose, Bld: 231 mg/dL — ABNORMAL HIGH (ref 70–99)
HCT: 26 % — ABNORMAL LOW (ref 39.0–52.0)

## 2010-08-15 LAB — URINE MICROSCOPIC-ADD ON

## 2010-08-15 LAB — POCT I-STAT 3, ART BLOOD GAS (G3+)
Acid-Base Excess: 3 mmol/L — ABNORMAL HIGH (ref 0.0–2.0)
O2 Saturation: 98 %
Patient temperature: 37.8
TCO2: 27 mmol/L (ref 0–100)
pCO2 arterial: 32.3 mmHg — ABNORMAL LOW (ref 35.0–45.0)

## 2010-08-15 LAB — LIPID PANEL
HDL: 26 mg/dL — ABNORMAL LOW (ref >39)
LDL Cholesterol: 31 mg/dL (ref 0–99)
Total CHOL/HDL Ratio: 2.8 RATIO
VLDL: 16 mg/dL (ref 0–40)

## 2010-08-15 LAB — URINALYSIS, ROUTINE W REFLEX MICROSCOPIC
Glucose, UA: NEGATIVE mg/dL
Ketones, ur: 15 mg/dL — AB
Ketones, ur: NEGATIVE mg/dL
Nitrite: POSITIVE — AB
Protein, ur: 100 mg/dL — AB
Protein, ur: NEGATIVE mg/dL
pH: 6.5 (ref 5.0–8.0)

## 2010-08-15 LAB — HEMOGLOBIN A1C: Mean Plasma Glucose: 114 mg/dL

## 2010-08-15 LAB — MAGNESIUM
Magnesium: 2.2 mg/dL (ref 1.5–2.5)
Magnesium: 2.4 mg/dL (ref 1.5–2.5)
Magnesium: 2.5 mg/dL (ref 1.5–2.5)
Magnesium: 2.6 mg/dL — ABNORMAL HIGH (ref 1.5–2.5)

## 2010-08-15 LAB — HEMOGLOBIN AND HEMATOCRIT, BLOOD
HCT: 22.6 % — ABNORMAL LOW (ref 39.0–52.0)
HCT: 25.5 % — ABNORMAL LOW (ref 39.0–52.0)
HCT: 27.6 % — ABNORMAL LOW (ref 39.0–52.0)
Hemoglobin: 7.3 g/dL — ABNORMAL LOW (ref 13.0–17.0)
Hemoglobin: 7.6 g/dL — ABNORMAL LOW (ref 13.0–17.0)
Hemoglobin: 8.2 g/dL — ABNORMAL LOW (ref 13.0–17.0)
Hemoglobin: 8.3 g/dL — ABNORMAL LOW (ref 13.0–17.0)

## 2010-08-15 LAB — VITAMIN B12: Vitamin B-12: 650 pg/mL (ref 211–911)

## 2010-08-15 LAB — CROSSMATCH
ABO/RH(D): A NEG
Antibody Screen: NEGATIVE

## 2010-08-15 LAB — HEPARIN INDUCED THROMBOCYTOPENIA PNL: Patient O.D.: 2.016

## 2010-08-15 LAB — VANCOMYCIN, RANDOM
Vancomycin Rm: 19.5 ug/mL
Vancomycin Rm: 36.8 ug/mL

## 2010-08-15 LAB — VANCOMYCIN, TROUGH: Vancomycin Tr: 16 ug/mL (ref 10.0–20.0)

## 2010-08-19 LAB — DIFFERENTIAL
Basophils Absolute: 0 10*3/uL (ref 0.0–0.1)
Basophils Absolute: 0 10*3/uL (ref 0.0–0.1)
Basophils Absolute: 0 10*3/uL (ref 0.0–0.1)
Basophils Relative: 0 % (ref 0–1)
Basophils Relative: 0 % (ref 0–1)
Eosinophils Absolute: 0 10*3/uL (ref 0.0–0.7)
Eosinophils Absolute: 0 10*3/uL (ref 0.0–0.7)
Eosinophils Absolute: 0 10*3/uL (ref 0.0–0.7)
Eosinophils Relative: 0 % (ref 0–5)
Lymphocytes Relative: 3 % — ABNORMAL LOW (ref 12–46)
Lymphocytes Relative: 6 % — ABNORMAL LOW (ref 12–46)
Lymphs Abs: 0.3 10*3/uL — ABNORMAL LOW (ref 0.7–4.0)
Lymphs Abs: 0.5 10*3/uL — ABNORMAL LOW (ref 0.7–4.0)
Monocytes Absolute: 0.7 10*3/uL (ref 0.1–1.0)
Monocytes Relative: 0 % — ABNORMAL LOW (ref 3–12)
Monocytes Relative: 3 % (ref 3–12)
Monocytes Relative: 3 % (ref 3–12)
Neutro Abs: 10.8 10*3/uL — ABNORMAL HIGH (ref 1.7–7.7)
Neutro Abs: 9.7 10*3/uL — ABNORMAL HIGH (ref 1.7–7.7)
Neutrophils Relative %: 94 % — ABNORMAL HIGH (ref 43–77)
Neutrophils Relative %: 94 % — ABNORMAL HIGH (ref 43–77)
WBC Morphology: INCREASED

## 2010-08-19 LAB — COMPREHENSIVE METABOLIC PANEL
ALT: 40 U/L (ref 0–53)
ALT: 55 U/L — ABNORMAL HIGH (ref 0–53)
AST: 43 U/L — ABNORMAL HIGH (ref 0–37)
Alkaline Phosphatase: 97 U/L (ref 39–117)
BUN: 24 mg/dL — ABNORMAL HIGH (ref 6–23)
CO2: 21 mEq/L (ref 19–32)
CO2: 27 mEq/L (ref 19–32)
Calcium: 10.5 mg/dL (ref 8.4–10.5)
Chloride: 119 mEq/L — ABNORMAL HIGH (ref 96–112)
Creatinine, Ser: 1.37 mg/dL (ref 0.4–1.5)
GFR calc Af Amer: >60 mL/min (ref >60)
GFR calc non Af Amer: 50 mL/min — ABNORMAL LOW (ref >60)
Glucose, Bld: 100 mg/dL — ABNORMAL HIGH (ref 70–99)
Glucose, Bld: 124 mg/dL — ABNORMAL HIGH (ref 70–99)
Potassium: 3.1 mEq/L — ABNORMAL LOW (ref 3.5–5.1)
Sodium: 150 mEq/L — ABNORMAL HIGH (ref 135–145)
Sodium: 158 mEq/L — ABNORMAL HIGH (ref 135–145)
Total Bilirubin: 0.8 mg/dL (ref 0.3–1.2)
Total Protein: 4.8 g/dL — ABNORMAL LOW (ref 6.0–8.3)

## 2010-08-19 LAB — URINALYSIS, ROUTINE W REFLEX MICROSCOPIC
Bilirubin Urine: NEGATIVE
Ketones, ur: NEGATIVE mg/dL
Ketones, ur: NEGATIVE mg/dL
Nitrite: NEGATIVE
Nitrite: POSITIVE — AB
Specific Gravity, Urine: 1.015 (ref 1.005–1.030)
Specific Gravity, Urine: 1.018 (ref 1.005–1.030)
Urobilinogen, UA: 1 mg/dL (ref 0.0–1.0)
Urobilinogen, UA: 1 mg/dL (ref 0.0–1.0)
pH: 6.5 (ref 5.0–8.0)

## 2010-08-19 LAB — CROSSMATCH: Antibody Screen: NEGATIVE

## 2010-08-19 LAB — CBC
HCT: 24 % — ABNORMAL LOW (ref 39.0–52.0)
HCT: 25.1 % — ABNORMAL LOW (ref 39.0–52.0)
HCT: 25.8 % — ABNORMAL LOW (ref 39.0–52.0)
HCT: 26.8 % — ABNORMAL LOW (ref 39.0–52.0)
HCT: 27 % — ABNORMAL LOW (ref 39.0–52.0)
Hemoglobin: 7.7 g/dL — ABNORMAL LOW (ref 13.0–17.0)
Hemoglobin: 8.1 g/dL — ABNORMAL LOW (ref 13.0–17.0)
Hemoglobin: 8.1 g/dL — ABNORMAL LOW (ref 13.0–17.0)
Hemoglobin: 8.6 g/dL — ABNORMAL LOW (ref 13.0–17.0)
Hemoglobin: 8.6 g/dL — ABNORMAL LOW (ref 13.0–17.0)
MCHC: 31.4 g/dL (ref 30.0–36.0)
MCHC: 31.9 g/dL (ref 30.0–36.0)
MCHC: 32.3 g/dL (ref 30.0–36.0)
MCHC: 32.9 g/dL (ref 30.0–36.0)
MCV: 95.6 fL (ref 78.0–100.0)
MCV: 95.9 fL (ref 78.0–100.0)
Platelets: 57 10*3/uL — ABNORMAL LOW (ref 150–400)
Platelets: 74 10*3/uL — ABNORMAL LOW (ref 150–400)
RBC: 2.53 MIL/uL — ABNORMAL LOW (ref 4.22–5.81)
RBC: 2.64 MIL/uL — ABNORMAL LOW (ref 4.22–5.81)
RBC: 2.69 MIL/uL — ABNORMAL LOW (ref 4.22–5.81)
RBC: 2.8 MIL/uL — ABNORMAL LOW (ref 4.22–5.81)
RBC: 2.89 MIL/uL — ABNORMAL LOW (ref 4.22–5.81)
RDW: 21.2 % — ABNORMAL HIGH (ref 11.5–15.5)
RDW: 21.3 % — ABNORMAL HIGH (ref 11.5–15.5)
RDW: 21.3 % — ABNORMAL HIGH (ref 11.5–15.5)
RDW: 21.5 % — ABNORMAL HIGH (ref 11.5–15.5)
RDW: 21.5 % — ABNORMAL HIGH (ref 11.5–15.5)
RDW: 22.1 % — ABNORMAL HIGH (ref 11.5–15.5)
WBC: 10 10*3/uL (ref 4.0–10.5)
WBC: 11.6 10*3/uL — ABNORMAL HIGH (ref 4.0–10.5)
WBC: 11.8 10*3/uL — ABNORMAL HIGH (ref 4.0–10.5)
WBC: 13.7 10*3/uL — ABNORMAL HIGH (ref 4.0–10.5)

## 2010-08-19 LAB — CULTURE, BLOOD (ROUTINE X 2)

## 2010-08-19 LAB — URINE MICROSCOPIC-ADD ON

## 2010-08-19 LAB — BASIC METABOLIC PANEL
BUN: 32 mg/dL — ABNORMAL HIGH (ref 6–23)
Calcium: 10.9 mg/dL — ABNORMAL HIGH (ref 8.4–10.5)
Creatinine, Ser: 1.36 mg/dL (ref 0.4–1.5)
GFR calc non Af Amer: 50 mL/min — ABNORMAL LOW (ref >60)
Glucose, Bld: 101 mg/dL — ABNORMAL HIGH (ref 70–99)

## 2010-08-19 LAB — VANCOMYCIN, TROUGH: Vancomycin Tr: 12.4 ug/mL (ref 10.0–20.0)

## 2010-08-19 LAB — BLOOD GAS, ARTERIAL
Bicarbonate: 21.3 mEq/L (ref 20.0–24.0)
Patient temperature: 98.6
pCO2 arterial: 37.5 mmHg (ref 35.0–45.0)
pH, Arterial: 7.374 (ref 7.350–7.450)
pO2, Arterial: 56.7 mmHg — ABNORMAL LOW (ref 80.0–100.0)

## 2010-08-19 LAB — HEPARIN INDUCED THROMBOCYTOPENIA PNL
Heparin Induced Plt Ab: NEGATIVE
Patient O.D.: 0.38
Serotonin Release: 0 % release (ref <20)

## 2010-08-19 LAB — VITAMIN B12: Vitamin B-12: 1259 pg/mL — ABNORMAL HIGH (ref 211–911)

## 2010-08-19 LAB — HEPATIC FUNCTION PANEL
ALT: 54 U/L — ABNORMAL HIGH (ref 0–53)
AST: 29 U/L (ref 0–37)
Albumin: 2 g/dL — ABNORMAL LOW (ref 3.5–5.2)
Bilirubin, Direct: 0.3 mg/dL (ref 0.0–0.3)

## 2010-08-19 LAB — PREPARE PLATELETS

## 2010-08-19 LAB — URINE CULTURE: Colony Count: >=100000

## 2010-08-19 LAB — PHOSPHORUS: Phosphorus: 2.5 mg/dL (ref 2.3–4.6)

## 2010-08-19 LAB — AMMONIA: Ammonia: 16 umol/L (ref 11–35)

## 2010-08-19 LAB — MAGNESIUM: Magnesium: 3 mg/dL — ABNORMAL HIGH (ref 1.5–2.5)

## 2010-08-19 LAB — PREALBUMIN: Prealbumin: 7.7 mg/dL — ABNORMAL LOW (ref 18.0–45.0)

## 2010-10-09 NOTE — Op Note (Signed)
NAMEARLESTER, KEEHAN               ACCOUNT NO.:  [id number]   MEDICAL RECORD NO.:  [id number]          PATIENT TYPE:  AMB   LOCATION:  DSC                          FACILITY:  [facility]   PHYSICIAN:  Leonie Man, M.D.   DATE OF BIRTH:  19-Jul-1924   DATE OF PROCEDURE:  03/16/2008  DATE OF DISCHARGE:                               OPERATIVE REPORT   PREOPERATIVE DIAGNOSIS:  Right inguinal hernia.   POSTOPERATIVE DIAGNOSIS:  Right inguinal hernia.   PROCEDURE:  Repair of right inguinal hernia with mesh.   SURGEON:  Leonie Man, MD   ASSISTANT:  OR technician.   ANESTHESIA:  General.   SPECIMENS:  There were no specimens sent to pathology.   ESTIMATED BLOOD LOSS:  Minimal.   COMPLICATIONS:  None.   DISPOSITION:  The patient returned to the PACU in excellent condition.   INDICATIONS:  Mr. Nilan Iddings is an 77 year old retired gentleman who  on physical examination was noted to have a rather large right-sided  inguinal hernia.  He is essentially asymptomatic.  He was referred from  Dr. Billee Cashing for repair of this large hernia.  The patient was  fully aware of the risks and potential benefits of surgery and gives his  consent to same.   PROCEDURE IN DETAIL:  The patient was positioned supinely and following  the induction of satisfactory general anesthesia, the abdomen was  prepped and draped to be included in a sterile operative field.  Positive identification of the patient as Jaesean Litzau and the  procedure be done as right inguinal hernia.  Antibiotics given  preoperatively, Ancef.  I infiltrated the lower abdominal area with 0.5%  Marcaine with epinephrine.  A transverse incision was made in the lower  abdominal crease and deepened through the skin and subcutaneous tissues  down to the external oblique aponeurosis.  The external oblique  aponeurosis was opened up through the external inguinal ring with  protection of the ilioinguinal nerve which was retracted  medially and  superiorly.  Spermatic cord was elevated and held with a Penrose drain.  Dissection along the anterior and medial aspects of the spermatic cord  revealed a rather large direct inguinal hernia sac which was dissected  free from the cord and reduced into the retroperitoneum.  The floor of  the transversalis fascia was closed with a running 2-0 Vicryl suture.  There was no indirect sac noted in the cord.  A onlay patch of  polypropylene mesh was fashioned and sutured into the pubic tubercle  with 2-0 Prolene.  I used the 2-0 Prolene in continuous sutures, sewing  the mesh to the conjoined tendon, carrying the suture line up to the  internal ring and again from the pubic tubercle along the shelving edge  of Poupart ligament up to the internal ring.  The tails of the mesh were  then trimmed and placed under the external oblique aponeurosis and  sutured down into the internal oblique muscles.  All areas of dissection  were checked for hemostasis and noted to be dry.  Additional injections  of 0.5% Marcaine were placed in  the pubic tubercle and the subcutaneous  fat.  The external oblique aponeurosis was closed, so as to  reapproximate the external inguinal ring with a running suture of 2-0  Vicryl suture.  Scarpa fascia was closed with running 3-0 Vicryl suture  and the skin was closed with 4-0 Monocryl.  This was reinforced with  Steri-Strips.  A sterile dressing was applied.  The anesthetic was  reversed.  The patient was moved from the operating room to the recovery  room in stable condition.  He tolerated the procedure well.      Leonie Man, M.D.  Electronically Signed     PB/MEDQ  D:  03/16/2008  T:  03/16/2008  Job:  [id number]

## 2010-10-12 NOTE — Discharge Summary (Signed)
[name], [name]               ACCOUNT NO.:  [id number]   MEDICAL RECORD NO.:  [id number]          PATIENT TYPE:  INP   LOCATION:  1410                         [facility]ACILITY:  [facility]   PHYSICIAN:  [name], M.D.    DATE OF BIRTH:  03/23/1926   DATE OF ADMISSION:  12/17/2005  DATE OF DISCHARGE:                                 DISCHARGE SUMMARY   INTERIM PRIORITY DISCHARGE SUMMARY:   DISCHARGE DIAGNOSES:  1.  Sepsis secondary to multiloculated hepatic abscess (culture positive for      fusobacterium).  2.  Status post ultrasound-guided liver abscess drainage on 12/19/2005, and      CT-guided drainage of residual larger abscess on 12/24/2005 (drains left      in, per interventional radiology).  3.  Transaminitis secondary to hepatic abscess.  4.  Coagulase-negative staphylococcus urinary tract infection.  5.  Oral thrush.  6.  Anemia secondary to acute and chronic disease.  7.  Acute renal failure secondary to prerenal azotemia.  8.  Elevated troponin I, thought to be secondary to acute renal failure      (creatinine kinase within normal limits).  9.  3.5 cm infrarenal abdominal aortic aneurysm, per CT scan of the abdomen      on 12/24/2005.  10. Hypertension.  11. Hypoalbuminemia/malnutrition.  12. Stable bilateral adrenal nodules.  13. Small bilateral pleural effusions/bibasilar atelectasis.  14. Delirium/confusion with history of dementia.   SECONDARY DISCHARGE DIAGNOSES:  1.  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  2.  Empyema status post thoracotomy and drainage in July of 2003.  3.  No evidence of pulmonary embolism, minimal ground glass densities in the      upper lobes suggesting small airway disease, aveolitis, per CT scan of      the chest on 12/14/2005.  4.  Ultrasound of the testicles on 12/13/2005; results revealed normal      testicles bilaterally, bilateral epididymal  cysts/spermatoceles or      possibly dilated vas deferens, small hydroceles.   DISCHARGE MEDICATIONS:  To be dictated at the time of hospital discharge.   CONSULTATIONS:  1.  Interventional radiology.  2.  Infectious disease specialist, [name]. [name], M.D.   PROCEDURES PERFORMED:  1.  CT scan of the head without contrast on 12/17/2005; results revealed age-      related atrophy and chronic small vessel disease.  No evidence of acute      or reversible findings.  2.  Ultrasound of the abdomen on 12/18/2005; results revealed a complex mass      in the left lobe of the liver measuring 6.6 by 6.2 by 7 cm.  3.  Ultrasound-guided percutaneous drainage of left liver abscess on      12/19/2005.  4.  CT scan of the abdomen and pelvis on 12/24/2005; results revealed      partial evacuation of multiloculated hepatic abscess.  There is a      separate 8 cm undrained component which would be amenable to  percutaneous drainage, 3.5 cm infrarenal abdominal aortic aneurysm,      stable bilateral adrenal nodules, small bilateral pleural effusions,      unremarkable colon and appendix, and marked prostatic enlargement with      distended urinary bladder.  5.  Status post CT-guided percutaneous drainage of hepatic abscess with      drain catheter left in.   HISTORY OF PRESENT ILLNESS:  The patient is a 77 year old man with a past  medical history significant for an hepatic abscess and lung empyema in July  of 2003, who presented to the emergency department on 12/17/2005 with the  chief complaint of generalized weakness, worsening confusion, and failure to  thrive.  When the patient was evaluated in the emergency department, he was  found to be hypotensive with a blood pressure of 80/50 and hypothermic with  a temperature of 96.2. His WBC was of 37,000.  The patient was, therefore,  admitted for further evaluation and management of sepsis.   For additional details, please see the dictated  history and physical.   HOSPITAL COURSE:  1.  SEPSIS/MULTILOCULATED HEPATIC ABSCESS/TRANSAMINITIS/COAGULASE-NEGATIVE      STAPHYLOCOCCUS URINARY TRACT INFECTION.  The patient was started on      volume resuscitation with normal saline.  Blood cultures and urine      culture were ordered.  He was started on empiric antibiotic treatment      with intravenous Zosyn and vancomycin.  Cardiac enzymes were ordered to      rule out evidence of myocardial ischemia or infarction, and a CT scan of      the head was ordered to rule out an acute intracranial abnormality which      could potentially cause confusion.  The results of the cardiac panel      were significant for an elevated troponin I.  However, the creatinine      kinase and CK-MB were within normal limits.  The elevated troponin I was      felt to be secondary to acute renal failure.  The CT scan of the head      revealed no acute intracranial abnormality.  Given the patient's past      medical history of hepatic abscesses, an ultrasound of the abdomen was      ordered to evaluate the patient's biliary system.  The ultrasound of the      abdomen, indeed, revealed a complex mass in the left lobe of the liver,      measuring greater than 6 cm.  This was felt to be a probable recurrence      of a hepatic abscess.  Interventional radiology was consulted for      drainage.  The drainage occurred percutaneously on 12/19/2005.  The      drain was left in.  Specimens were sent for culture.  The culture of the      abscess specimen remained negative for several days until it finally      became positive for a few fusobacterium.  Zosyn was continued for      antibiotic coverage.   The patient also had evidence of a mild coagulopathy on admission with a PT  of 17.4, INR of 1.4 and PTT of 48.  He was transfused 2 units of fresh  frozen plasma.  In addition, the patient's transaminases were elevated with an AST of 45, ALT of 59, alkaline phosphatase  of 205, total bilirubin of  3.1, and direct bilirubin of 1.5.  The elevated transaminases were obviously  the consequence of the hepatic abscesses.  Over the course of the  hospitalization, the extent of the transaminitis subsided and then resolved.  The hypotension resolved.  The coagulopathy resolved.  His sensorium also  improved significantly.  For a followup of the drainage of the hepatic  abscess, a CT scan of the abdomen and pelvis was ordered.  On 12/24/2005,  the CT scan revealed a multiloculated residual component of the original  hepatic abscess.  The CT scan also revealed a 3.5 cm infrarenal abdominal  aortic aneurysm, stable bilateral adrenal nodules, and small bilateral  pleural effusions.  Given the finding of the newer, larger, residual hepatic  abscess, Dr. Deanne Coffer, interventional radiologist, drained the area and  placed another drain for further treatment.  The patient tolerated the  procedure well.   Infectious disease physician, Dr. Johny Sax, was consulted for further  recommendations regarding antibiotic therapy, given that the patient's  abscess had progressed on vancomycin and Zosyn.  Of note, Flagyl was added  approximately 5 days ago for additional anaerobic coverage.  Per Dr.  Moshe Cipro assessment, the vancomycin and Flagyl could be discontinued.  However, he recommended continuing therapy with Zosyn.  The patient is  currently on hospital day #10 of Zosyn therapy.  He completed 9 days of  vancomycin therapy and 5 days of therapy on Flagyl.  The patient is  currently hemodynamically stable, afebrile and without leukocytosis.  Per  the recommendation of the interventional radiology team, the patient will  need to have another followup CT scan of the abdomen to assess for interval  changes in the next 3-7 days, depending on the extent of ongoing drainage.  1.  COAGULASE-NEGATIVE STAPHYLOCOCCUS URINARY TRACT INFECTION.  The      patient's urine culture grew  out 40,000 colonies of coagulase-negative      staphylococcus.  As indicated above, he was started on vancomycin      empirically.  The patient has no complaints of dysuria currently.  2.  ORAL THRUSH.  The patient developed a white exudate on his tongue.  He      was started on intravenous and oral Diflucan.  After several days the      Diflucan was discontinued, as the oral thrush had resolved.  3.  NORMOCYTIC ANEMIA.  The patient's hemoglobin on admission was 10.9.      Over the course of hospitalization, it fell to a nadir of 8.  Iron      studies revealed a total iron of 70, TIBC of 149, percent saturation of      47, vitamin B12 of 1198, folate of 5.2, and ferritin of 2123.  His TSH      was also assessed and found to be within normal limits at 0.767.  The      patient's stools were assessed for microscopic blood and they have been      negative thus far.  As of today, the patient's hemoglobin is 9.3.  He     has not required any blood transfusions during hospitalization.  More      than likely, the patient's anemia is secondary to the acute infection,      possibly multiple venipunctures, antibiotics, and chronic disease.  4.  ACUTE RENAL FAILURE SECONDARY TO PRERENAL AZOTEMIA.  The patient's BUN      on admission was elevated at 65 and his creatinine was elevated at 3.      Following volume repletion,  his renal function progressively improved.      As of today, his BUN is 9 and creatinine is 1.2.  5.  HYPERTENSION.  On admission, the patient's blood pressure was quite low,      as indicated above.  However, with volume repletion and the management      of his acute illnesses, his blood pressure progressively improved.  Over      the past few days, his systolic blood pressure has increased above 140      consistently.  Therefore, Norvasc was restarted at 10 mg daily.  6.  HYPOALBUMINEMIA.  The patient's most recent albumin is 1.5.  The      hypoalbuminemia is secondary to the hepatic  abscess and overall      malnutrition.  The patient's appetite has been surprisingly good.  The      registered dietician was consulted and provided the patient with      nutritional supplements.  7.  CONFUSION/DELIRIUM/DEMENTIA.  On admission, the patient was very      confused.  Over the course of the hospitalization, his confusion has      subsided but not completely resolved.  Upon questioning the patient's      family, he has had a relatively sub-acute history of worsening memory.      Given this history, more than likely the patient has underlying dementia      manifested by worsening confusion over the past 1-2 years, per the      family's account.  Over the past few days, however, the patient has been      much more alert and oriented, although his short-term memory reveals      somewhat of a deficit.   DISPOSITION:  The patient is not quite ready for hospital discharge yet.  Pending is another followup CT scan of the abdomen to assess for interval  changes of the hepatic drainage.  It is unclear whether or not the patient  will go  home, or if a short-term skilled nursing facility placement is needed for  ongoing rehabilitation.  As of today, he is ambulating some with the nursing  staff.   This is an interim summary.  A followup  summary will be dictated by the  discharging physician.      [name], M.D.  Electronically Signed     DF/MEDQ  D:  12/27/2005  T:  12/27/2005  Job:  [id number]   cc:   Lorelle Formosa, M.D.  Fax: [phone]

## 2010-10-12 NOTE — Discharge Summary (Signed)
[name], [name]                           ACCOUNT NO.:  [id number]   MEDICAL RECORD NO.:  [id number]                   PATIENT TYPE:  INP   LOCATION:  5725                                 FACILITY:  [facility]   PHYSICIAN:  Lorelle Formosa, M.D.           DATE OF BIRTH:  03/14/1926   DATE OF ADMISSION:  12/11/2001  DATE OF DISCHARGE:  01/07/2002                                 DISCHARGE SUMMARY   ADMISSION DIAGNOSES:  1. Hepatic abscesses.  2. Hypertension.  3. Weight loss.   DISCHARGE DIAGNOSES:  1. Herpetic abscesses secondary to Fusobacterium necrophorum.  2. Hypertension.  3. Empyema status post video-assisted thoracic surgery/thoracotomy with     decortication.   CONDITION ON DISCHARGE:  Stable.   DISPOSITION:  The patient is to follow up in approximately one week, follow  up with cardiovascular and thoracic surgery, Dr. Laneta Simmers, on September 2.  The patient will have a chest x-ray, PA and lateral, prior to that visit.   CONSULTATIONS:  1. Infectious disease, Dr. Roxan Hockey, et al.  2. Cardiovascular and thoracic surgery, Dr. Laneta Simmers.  3. Diagnostic radiology, Dr. Jimmie Molly.   HISTORY OF PRESENT ILLNESS:  This patient is a 77 year old black male who  was admitted via the radiology department after a CT scan with contrast  revealed abscesses instead of malignancies.  The patient has had evaluation  on July 13 where CT of the abdomen and pelvis revealed a 6-7 cm, and 3 cm  liver lesion.  He was seen for followup on July 14 in my office and  scheduled for needle biopsies.  For abscesses, he was admitted for IV  antibiotics and further evaluation.  The patient initially presented to my  office for evaluation for a lack of energy on November 25, 2001.  His vital signs  revealed a blood pressure of 122/84, pulse 70, respiratory rate 18, and  temperature afebrile.  Weight was 232 pounds, height 6 feet 4 inches.  The  patient thought he had lost approximately 20 pounds over a  two-month period  due to loss of appetite.  He denied nausea, vomiting, peptic ulcer disease,  hiatal hernia or reflux.  He had no bowel irregularities.  He was thus  admitted for further inpatient type of assessment.   Past medical history, family history, personal history, social history, and  review of systems are elicited on the admission history and physical.   MEDICINES AT HOME:  Lasix 20 mg daily and Norvasc 10 mg daily.  The patient  had also been using alfalfa and garlic and vitamin C.   PHYSICAL EXAMINATION:  VITAL SIGNS:  Vital signs revealed blood pressure  133/77, pulse 70, respirations 20, temperature 98.4.  GENERAL:  The patient appeared alert and oriented.  He was slightly anxious.  HEENT:  Pupils equal, round and reactive to light and accommodation.  Extraocular muscles were intact.  The mouth exam revealed moist mucous  membranes with tongue and uvula in the midline.  Dentition was appropriate.  NECK:  Supple with no jugulovenous distention, bruit, or thyromegaly.  CHEST:  Clear to auscultation.  HEART:  Regular rhythm and rate without murmur.  ABDOMEN:  Soft and flat with no masses.  Bowel sounds were normal.  Abdominal examination revealed no tenderness to palpation.  GENITALIA:  Bilaterally descended testicles with circumcised penis.  RECTAL:  Examination revealed normal sphincter tone with occult blood  negative.  Prostate was normal.  SKIN:  Examination revealed no lesions.  EXTREMITIES:  No edema and had appropriate range of motion.  NEUROLOGIC:  Examination revealed the patient to be grossly weak.   LABORATORY DATA:  CBC revealed white blood cell count of 20,700 with a  hemoglobin of 10.9, hematocrit 32.5, platelet count 561,000 with 82%  neutrophils, 10% lymphocytes, 8% monocytes, and 1% eosinophils.  Sedimentation rate was 118.  Hemoglobin dropped down to 7.4 and after two  unit packed red blood cell transfusion hemoglobin was 9.2 with a white blood  cell  count of 11.984.  Chemistries initially revealed a potassium of 3.50  with an albumin less than 2.  The potassium was corrected to 3.8 on repeat,  calcium being 10.6.  CK was 12, CK/MB 0.7.  Carcinoembryonic antigen less  than 0.5.  Urinalysis was normal.  Blood type was A negative.  Blood  cultures had no growth at two days.  Body fluid culture on August 28 had no  growth.  Urine cultures had no growth.  Abscess cultures on July 18 had a  few gram-negative rods and gram-negative cocci in pairs.  Anaerobic cultures  had no growth and one sample anaerobic culture of both superior liver  abscesses grew few Fusobacterium necrophorum, beta lactamase negative.  __________  antibody for amoebiasis was negative.  CT of the abdomen with  and without contrast and CT pelvis with contrast revealed multiple liver  lesions with central low attenuation and thick irregular enhancing walls  both compatible with liver abscesses.  There was no intra-abdominal source  of the patient's presumed liver abscesses and a very small abdominal aortic  aneurysm, small left renal calculus.  There was non-specific small left  adrenal nodule thought to be benign.  CT biopsy revealed aspiration of one  of the low enhancing liver lesions yielded results compatible with a liver  abscess.  CT-guided hepatic abscess drainage x2 revealed 175 cc of copious  of pus drainage from each abscess without complications immediately.  PICC  line was placed with ultrasound guidance on July 22 in preparation and  anticipation of prolonged outpatient antibiotic therapy.  Chest x-ray  revealed opacity present in the right mid lung and right lung base with mild  opacity of the left base, rule out pneumonia with probable small effusions.  Repeat chest x-ray on August 2 revealed improved aeration of the right lower lobe.  Contrast injection of pre-existing hepatic drainage abscess drained  under fluoroscopy on August 8 revealed a small cavity  filling around the pre-  existing and only remaining hepatic drainage catheter had prompt filling of  the right hepatic vein.   HOSPITAL COURSE:  The patient was seen by infectious disease and placed on  intravenous antibiotics.  Initially, he was given Tequin 4 gm IV daily and  this was changed to Zosyn 3.375 gm IV q.6h.  The patient had drains placed  into the two hepatic abscesses and subsequently had empyema of the right  lung which required video-assisted  thoracic surgery and thoracotomy  decortication.  Apparently, both liver drains traversed the pleural space.  The patient was changed to ceftriaxone 1 gm IV q.24h. and Flagyl 500 mg p.o.  t.i.d. on July 25 and Zosyn was discontinued.  Rocephin and Flagyl were  discontinued on July 26, and Unasyn 3 gm IV q.8h. was began.  The patient  had transfusion of two units of packed red blood cells and at one interval  became very confused and had some hallucinations.  His chest tubes were  discontinued after the liver drains and he removed and he is found to have  no significant pneumothorax by followup chest x-ray.  He is doing well and  his abscesses are shrinking.  There is a third abscess that is going to be  treated with antibiotics that was smaller.  He was thus ambulatory and fully  back  to his appropriate mental status.  He has good support from his family who  lives in this area and in the [place] areas.  He is thus discharged fro  outpatient management and will have by recommendations a minimum of two  weeks of Augmentin 875 mg b.i.d.  He will be monitored.                                               Lorelle Formosa, M.D.    WWM/MEDQ  D:  01/07/2002  T:  01/11/2002  Job:  [id number]

## 2010-10-12 NOTE — Op Note (Signed)
[name], [name]                           ACCOUNT NO.:  [id number]   MEDICAL RECORD NO.:  [id number]                   PATIENT TYPE:  INP   LOCATION:  3307                                 FACILITY:  [facility]   PHYSICIAN:  Evelene Croon, M.D.                  DATE OF BIRTH:  03/14/1926   DATE OF PROCEDURE:  12/21/2001  DATE OF DISCHARGE:                                 OPERATIVE REPORT   PREOPERATIVE DIAGNOSIS:  Right empyema.   POSTOPERATIVE DIAGNOSIS:  Right empyema.   OPERATIVE PROCEDURE:  Right thoracotomy, drainage of empyema, and  decortication of the right lung.   ATTENDING SURGEON:  Alleen Borne, M.D.   ASSISTANT:  Toribio Harbour, R.N.F.A.   ANESTHESIA:  General endotracheal.   CLINICAL HISTORY:  This patient is a 77 year old gentleman, who was admitted  with hepatic abscesses.  These were treated percutaneously with pigtail  catheters by radiology.  The patient developed some pains in his right  chest, and a follow-up CT scan showed development of a multiloculated right  empyema.  It was felt that this was most likely secondary to the hepatic  catheters traversing the pole space.  This was a new finding on the CAT scan  since these catheters were placed.  After review of the CT scan and  examination of the patient, it was felt that drainage of this empyema was  necessary to prevent progression with respiratory failure and sepsis.  I  discussed the operative procedure with the patient and discussed the  possibility of doing this thorascopic or that he may need thoracotomy for  drainage.  I discussed alternatives, benefits, and risks including bleeding,  infection, injury to the lung, recurrent infection, and chronic pain from  the thoracotomy incision.  He understood and agreed to proceed.   OPERATIVE PROCEDURE:  The patient was taken to the operating room and placed  on the table in a supine position.  After induction of general endotracheal  anesthesia  using a double lumen tube, the patient was positioned in the left  lateral decubitus position with the right side up.  The right chest was  prepped with Betadine soap and solution and draped in the usual sterile  manner.  The two hepatics drains were left in place and were secured to the  lateral chest wall and draped out of the field.  I initially tried to  perform thoracoscopy.  A small transverse incision was made in the anterior  axillary line at approximately in the fourth intercostal space, and a 10 mm  trocar was inserted.  There were effusions from the empyema throughout  through the pleural space.  After further examination, it was evident that  this empyema would not be drainable using thoracoscopy, and plans were made  to perform thoracotomy.  Then a right lateral thoracotomy incision was made  and carried down through the subcutaneous tissue  and muscle layers using  electrocautery.  The portal space was entered through the sixth intercostal  space.  The right lung was effused to this area and was carefully dissected  off of the chest wall.  A laminar spreader was used to spread the ribs apart  and allow exposure to the pleural space.  There was an extensive  multiloculated empyema.  Most of the fluid was gelatinous and serous-  appearing.  Some of the fluid was frankly purulent, especially posteriorly.  The two hepatic drains were visible traversing the pleural space and the  right lateral costophrenic sulcus.  These drains were left in place.  The  empyema was completely drained.  The right lung was decorticated and had a  thick, fibro nous peel on it, especially over the posterior aspect of the  upper and lower lobes.  There was also a large fluid collection within the  major fissure.  After complete decortication, the right lung was fully then  expanded.  There were no visible air leaks.  The pleural fluid was sent for  culture.  The pleural space was irrigated with saline  solution.  There was  good hemostasis.  Then, two chest tubes were placed with a right-angle tube,  positioned in the right costophrenic sulcus near the hepatic drains.  A 36  French straight tube was brought through a separate stab incision and  positioned posteriorly out to the apex.  Then the ribs were reapproximated  with #2 Vicryl pericostal sutures.  Then the On-Q intercostal anesthesia  system was inserted in the usual manner.  The intercostal nerves above and  below the thoracotomy interspace were anesthetized with 0.25% Marcaine local  anesthesia.  Then the muscle layers were closed with continuous #1 Vicryl  suture.  The subcutaneous tissue was closed with continuous 2-0 Vicryl and  the skin with staples.  The sponge, needle, and instrument counts were  correct according to the scrub nurse.  Dry sterile dressing was applied over  the incision and around the chest tube, which were _____suction.  The  patient remained hemodynamically stable and was turned in the supine  position, extubated, and transported to the postanesthesia care unit in  satisfactory condition and stable condition.                                               Evelene Croon, M.D.    BB/MEDQ  D:  12/21/2001  T:  12/25/2001  Job:  [id number]

## 2010-10-12 NOTE — Discharge Summary (Signed)
[name], [name]                           ACCOUNT NO.:  [id number]   MEDICAL RECORD NO.:  [id number]                   PATIENT TYPE:  INP   LOCATION:  5725                                 FACILITY:  [facility]   PHYSICIAN:  Lorelle Formosa, M.D.           DATE OF BIRTH:  03/14/1926   DATE OF ADMISSION:  12/11/2001  DATE OF DISCHARGE:  01/07/2002                                 DISCHARGE SUMMARY   ADMISSION DIAGNOSIS:  Liver abscess.   SECONDARY DIAGNOSIS:  1. Right-sided empyema.  2. Hypertension.   DISCHARGE DIAGNOSES:  1. Liver abscess.  2. Right-sided empyema.   HOSPITAL COURSE:  [name] was admitted to [facility] on December 11, 2001, at which time he was diagnosed with liver abscess.  He also was found  to have a right-sided empyema secondary to this.  Because of this the  patient had two liver drains placed by intervention of radiology.  Following  this procedure, Dr. Evelene Croon was consulted secondary to his right-sided  empyema.  Because of this Dr. Laneta Simmers recommended surgical intervention.  On  December 21, 2001, Dr. Laneta Simmers performed a right thoracotomy with drainage of  empyema and decortication of the right lung.  No complications noted during  the procedure.  Postoperatively the patient had his liver drains kept in  place as well as chest tube.  He was given IV antibiotics which consisted of  Unasyn.  He received a total of 18 days of IV Unasyn.  Initially  postoperatively the patient had a problem with pain control.  This was  alleviated with PCA Dilaudid.  The patient maintained adequate pain control  with his Dilaudid.  His white blood cell count decreased to normal values,  and he remained afebrile.  Postoperatively he also required transfusion of  one unit of packed red blood cells secondary to anemia.  His hemoglobin and  hematocrit at 9.2 and 28.3.  Also following his postoperative course, the  patient's liver drains and chest tube were kept in  place to ensure adequate  drainage of his liver abscess and empyema.  His final liver drain was  subsequently removed on January 04, 2001.  His chest tube was removed 48  hours following the removal of his final liver drain without difficulty.  He  was subsequently deemed stable for discharge on January 07, 2002, which was  postoperative day #17.   MEDICATIONS AT TIME OF DISCHARGE:  1. Tylox 1-2 tablets every 4-6 hours as needed for pain.  2. Aspirin 325 mg one daily.  3. Multivitamin one daily.  4. Protonix 40 mg one daily.  5. Augmentin 875 mg one tablet twice daily for 14 days.   ACTIVITY:  The patient was told to avoid driving, strenuous activity,  lifting heavy objects. He was told to walk daily.  Continue using his  incentive spirometer.   DIET:  Regular.   WOUND CARE:  The patient was told he could shower and cleanse his incisions  with soap and water.   DISPOSITION:  Home care.   FOLLOW UP:  The patient was told to follow up with his medical doctor as  directed.  He was also instructed to see Dr. Evelene Croon at the [facility] office  in approximately two weeks.  The office will call him to verify time and  date of this appointment.  He was told to go to the [facility] one hour before his appointment with Dr. Laneta Simmers.      Levin Erp. Steward, P.A.                      Lorelle Formosa, M.D.    BGS/MEDQ  D:  01/06/2002  T:  01/09/2002  Job:  [id number]   cc:   Alleen Borne, M.D.

## 2010-10-12 NOTE — H&P (Signed)
[name], [name]               ACCOUNT NO.:  [id number]   MEDICAL RECORD NO.:  [id number]          PATIENT TYPE:  INP   LOCATION:  0102                         FACILITY:  [facility]   PHYSICIAN:  Lonia Blood, M.D.       DATE OF BIRTH:  03/23/1926   DATE OF ADMISSION:  12/17/2005  DATE OF DISCHARGE:                                HISTORY & PHYSICAL   PRIMARY CARE PHYSICIAN:  Lorelle Formosa, M.D.   CHIEF COMPLAINTS:  Altered mental status and generalized weakness.   HISTORY OF PRESENT ILLNESS:  [name] is a 77 year old African-American man  who presents to [facility] emergency room for complaints of generalized  weakness and failure to thrive.  His family noted that the patient has been  unable to eat and very unsteady on his feet for the past days.  He has just  not been himself, acting strange and being more confused.  The patient does  not have any significant medical history except for some hepatic abscesses  in July 2003 but otherwise no major history of any surgical medical  illnesses in the past.   PAST MEDICAL HISTORY:  1.  Hypertension.  2.  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  3.  Empyema status post thoracotomy and drainage in July 2003.   MEDICATIONS AT HOME:  The patient had a visit to the emergency room on December 13, 2005.  He was started on azithromycin and pain medications.  Including  nonsteroidal anti-inflammatory drugs. Before that the patient was not taking  any medications.   SOCIAL HISTORY:  The patient is recent a widower.  He has multiple children.  He is currently retired.  He was fairly active before this admission. Two  weeks ago the patient was independent with all his activities of daily  living.  He does not smoke cigarettes, does not drink alcohol.  Does not use  any illicit drugs.   FAMILY HISTORY:  Noncontributory.   REVIEW OF SYSTEMS:  The patient denies chest pain.   Denies shortness of  breath.  He denies any coughing.  The patient denies headache.  He denies  abdominal pain.  He reports alternating constipation and diarrhea but no  massive diarrhea recently. The patient does not report any focal numbness,  weakness.   PHYSICAL EXAMINATION:  VITAL SIGNS:  Upon admission shows a temperature  96.2, blood pressure 80/50, pulse 97, respirations 22, saturation 90% on  room air.  GENERAL APPEARANCE:  A well-developed, well-nourished African-American man  in no acute distress, confused on the stretcher, oriented only to himself  and his family but not to place and time.  HEENT:  His head appears normocephalic, atraumatic.  Eyes have some faint  scleral icterus.  Extraocular movements intact.  Pupils are equal, round and  reactive to light and the patient's mouth is without any significant  ulcerations.  Very poor dentition.  NECK: Supple June no JVD.  No carotid bruits.  CHEST: Clear to auscultation bilaterally without wheezes, rhonchi or  crackles.  HEART:  Regular rate and rhythm without murmurs, rubs or gallops.  ABDOMEN:  Soft, nontender, nondistended.  Bowel sounds are present.  LOWER EXTREMITIES: Have no edema.  Skin is warm and dry without any  suspicious rashes.  NEUROLOGY:  Cranial nerves III through XII seem to be intact. Strength 5/5  in all four extremities.  Sensation appears to be intact.   LABORATORY VALUES:  At time of admission white blood cell count is 37,000,  hemoglobin 10.9, platelet count is 316. Sodium is 138, potassium 3.7,  chloride 105, bicarb 24, glucose 104, BUN of 65, creatinine 3.0, calcium  11.6.   Portable chest x-ray shows some mild cardiomegaly and some atelectasis.  No  clear infiltrates.  An EKG shows right bundle branch blocks. Three days ago  the patient had a computer tomography scan of the chest that did not show  any pulmonary embolus.   ASSESSMENT/PLAN:  1.  Sepsis of unclear etiology.  The patient's  history is not helpful in      clarifying the exact source of this patient's infection.  He does seem      to have a significant sepsis syndrome.  Plan is to admit the patient      hospital, start him on broad-spectrum antibiotics after obtaining      cultures as well as intravenous fluids.  Once his renal function      improves he will need a computer tomography scan of his abdomen with      intravenous contrast to rule out recurrence of hepatic abscesses. Also      because of this patient's altered mental status we will obtain computer      tomography scan of head right away.  2.  Acute renal failure.  The patient has multiple reasons to have acute      renal failure.  He is dehydrated.  He has received recently intravenous      contrast for his computer tomography scan of chest as well as he has      been recently on nonsteroidal anti-inflammatory drugs.  Plan is to give      the patient generous intravenous fluids, monitor his urine output, place      Foley catheter to assure that he does not have any obstruction.  3.  Altered mental status. I think this is multifactorial secondary to      sepsis syndrome as well as the fact the patient takes Vicodin.  We will      follow the patient closely in intensive care unit.      Lonia Blood, M.D.  Electronically Signed     SL/MEDQ  D:  12/17/2005  T:  12/18/2005  Job:  [id number]   cc:   Lorelle Formosa, M.D.  Fax: [phone]

## 2011-02-07 IMAGING — CR DG CHEST 1V PORT
1 series · 1 of 1 positions shown · non-contrast
Comparison: 03/11/2008

CLINICAL DATA: Chest pain.

PORTABLE CHEST - 1 VIEW

[view not recorded]
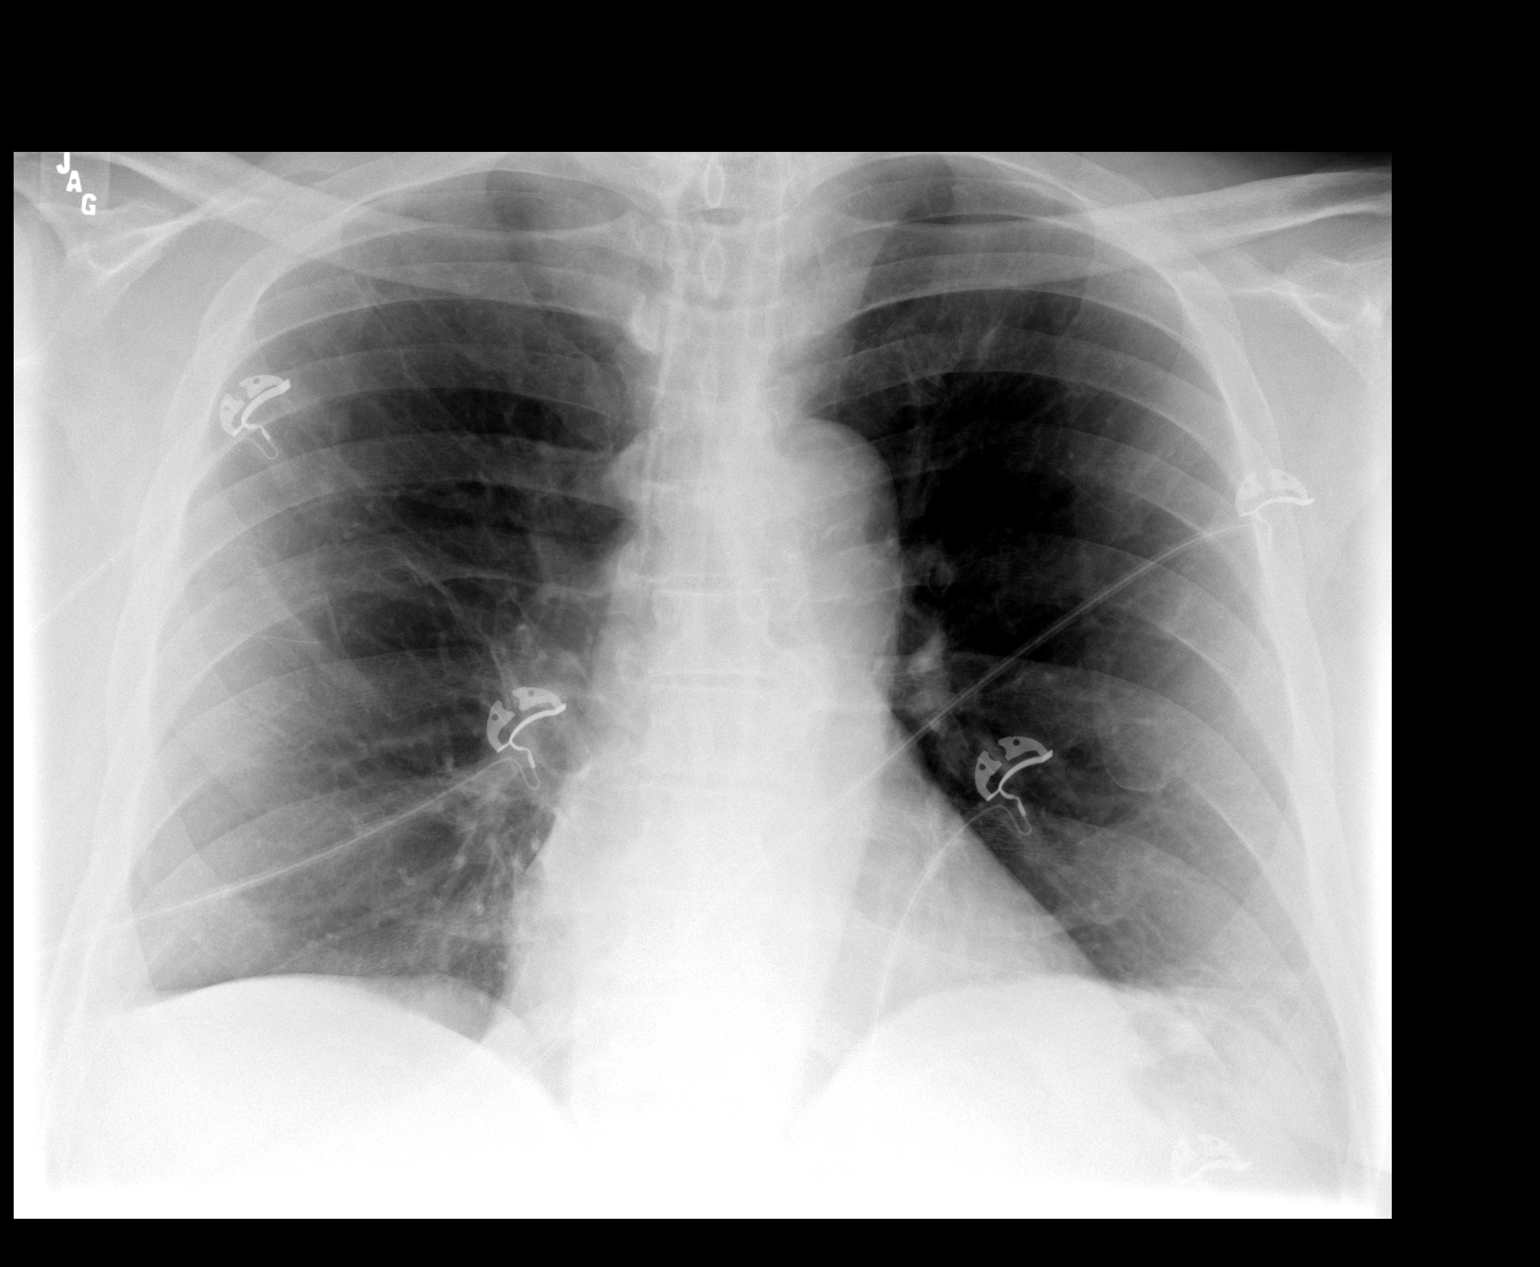

[1 of 1 positions shown; findings below may reference images not displayed]

FINDINGS: The heart size and vascularity are normal and the lungs
are clear of infiltrates and effusions.  There is evidence of
linear scarring in the right mid zone and left base, stable.
IMPRESSION: No significant abnormalities.

## 2011-02-11 IMAGING — CR DG CHEST 1V PORT
1 series · 1 of 1 positions shown · non-contrast
Comparison: Earlier today.

CLINICAL DATA: Unstable angina.  Balloon pump adjusted.

PORTABLE CHEST - 1 VIEW

[AP]
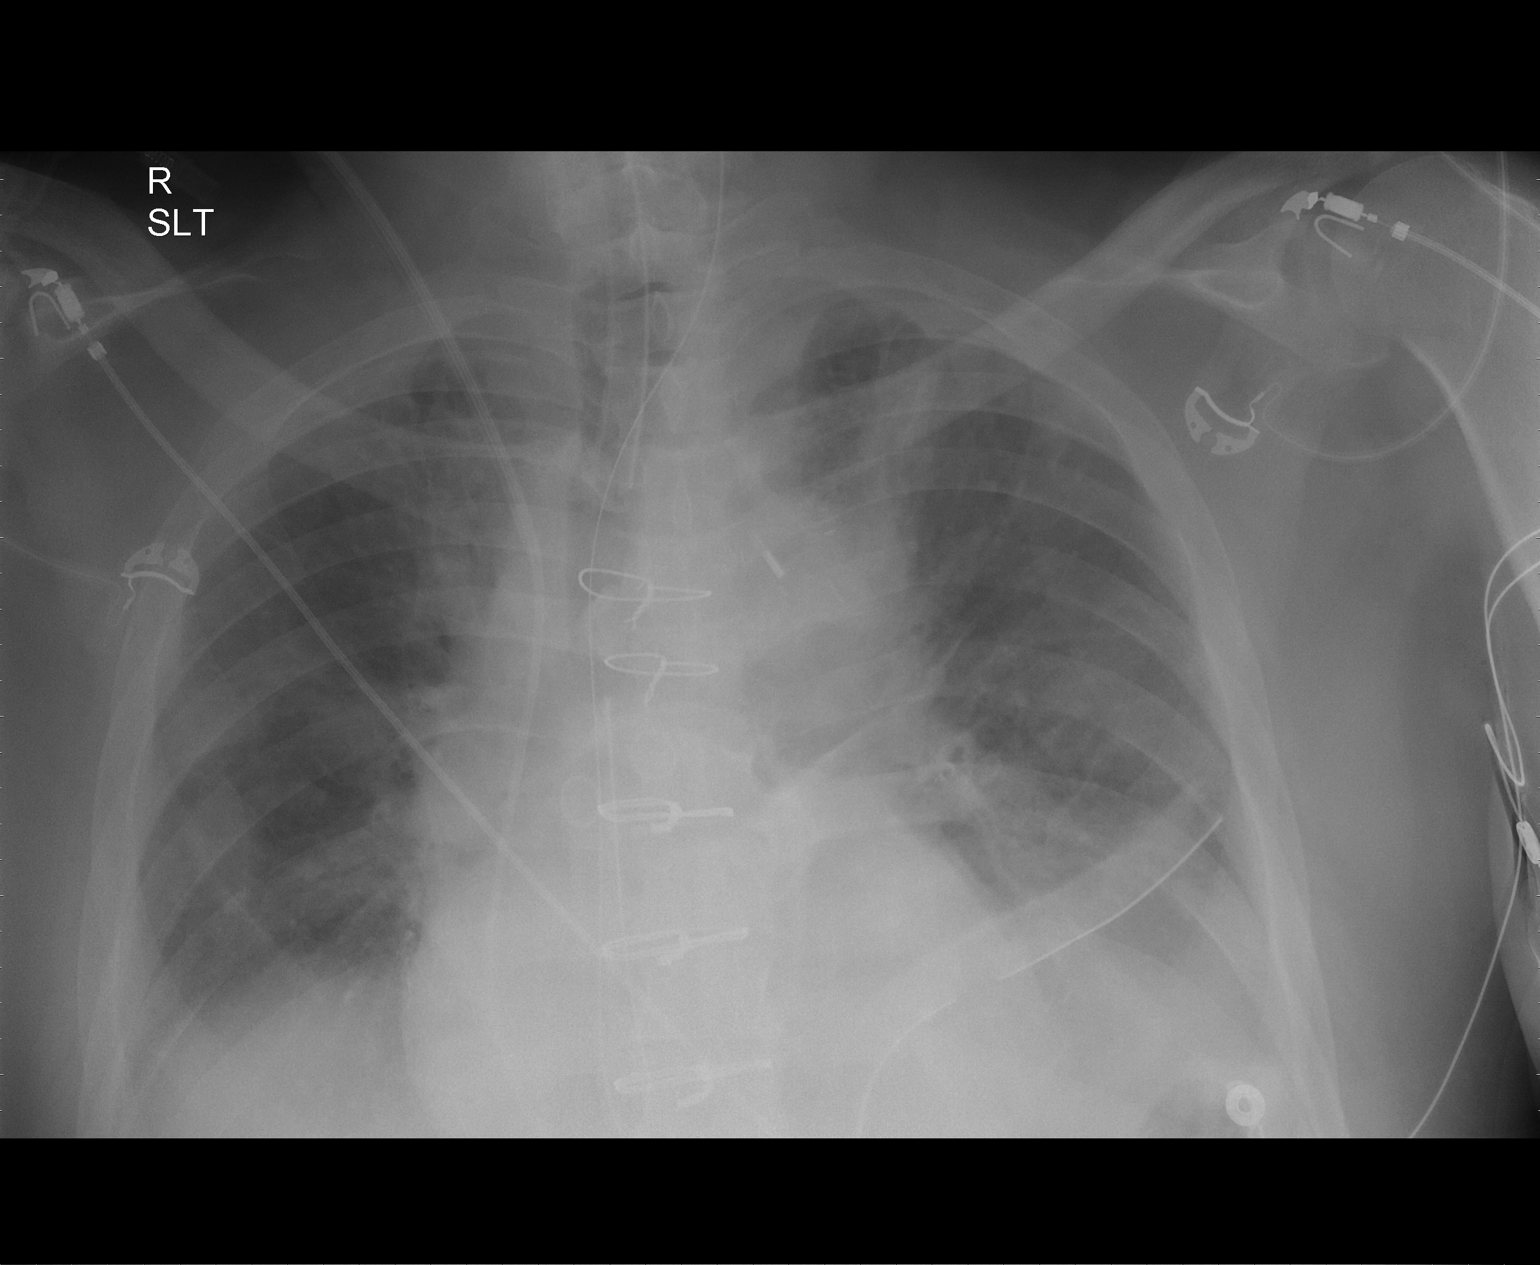

[1 of 1 positions shown; findings below may reference images not displayed]

FINDINGS: The endotracheal tube remains in satisfactory position.
Nasogastric tube tip in the proximal stomach.  The right jugular
Swan-Ganz catheter tip is in the intersegmental pulmonary artery on
the right.  Stable post CABG changes and mediastinal and left chest
tubes.  No pneumothorax seen.  Mild bibasilar atelectasis.
IMPRESSION: Mild bibasilar atelectasis.

## 2011-02-11 IMAGING — CR DG CHEST 1V PORT
1 series · 1 of 1 positions shown · non-contrast
Comparison: Chest radiograph dated 06/23/2009

CLINICAL DATA: Unstable angina.  Status post CABG.

PORTABLE CHEST - 1 VIEW

[view not recorded]
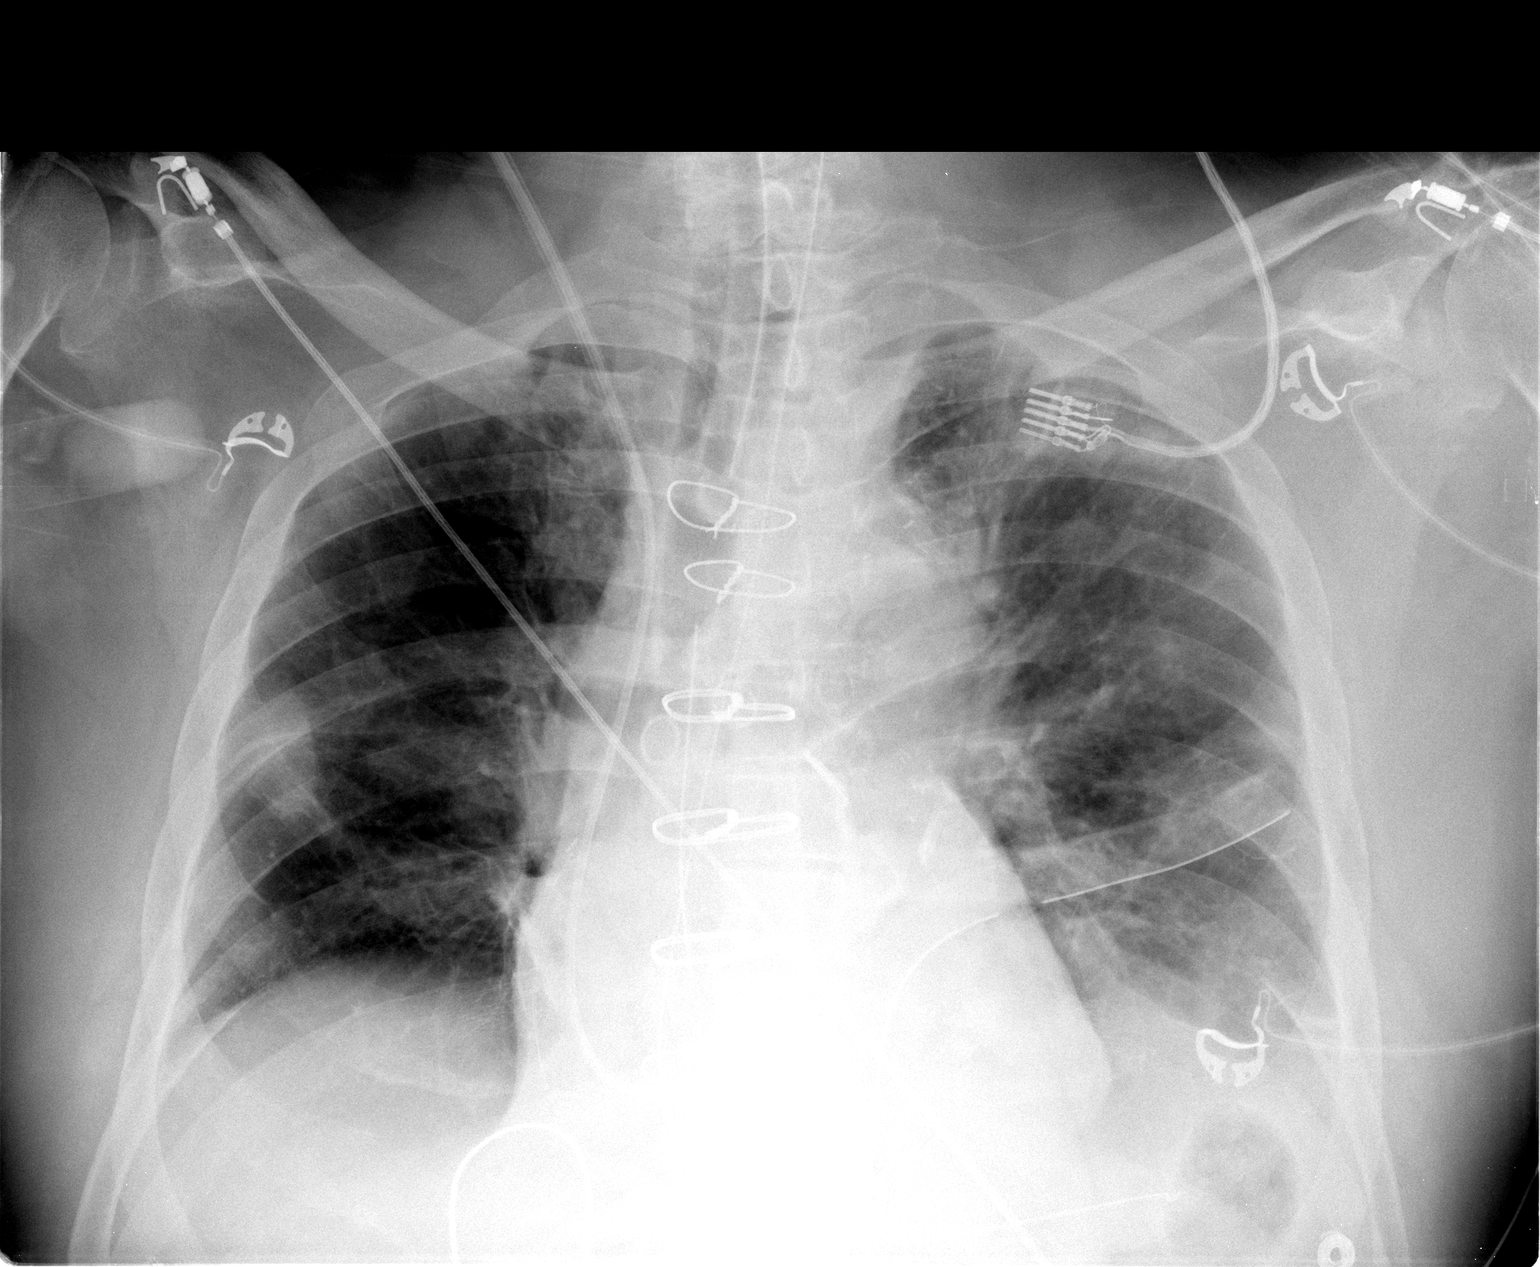

[1 of 1 positions shown; findings below may reference images not displayed]

FINDINGS: Endotracheal tube is 5.8 cm above the carina.  The
patient is status post a median sternotomy.  The patient now has
mediastinal and left chest drains.  Nasogastric tube terminates in
the gastric fundus region.  There is a pulmonary arterial catheter
that terminates near the right pulmonary artery.  Patchy lung
densities could represent atelectasis or mild edema.  Difficult to
exclude a small amount of pleural fluid.  No evidence for a large
pneumothorax.
IMPRESSION: Postoperative changes and the support apparatuses are appropriately
positioned.

Patchy areas of atelectasis or mild edema.

## 2011-02-12 IMAGING — CR DG CHEST 1V PORT
1 series · 1 of 1 positions shown · non-contrast
Comparison: Portable exam 3233 hours compared to 06/23/2009

CLINICAL DATA: Coronary artery disease status post CABG

PORTABLE CHEST - 1 VIEW

[view not recorded]
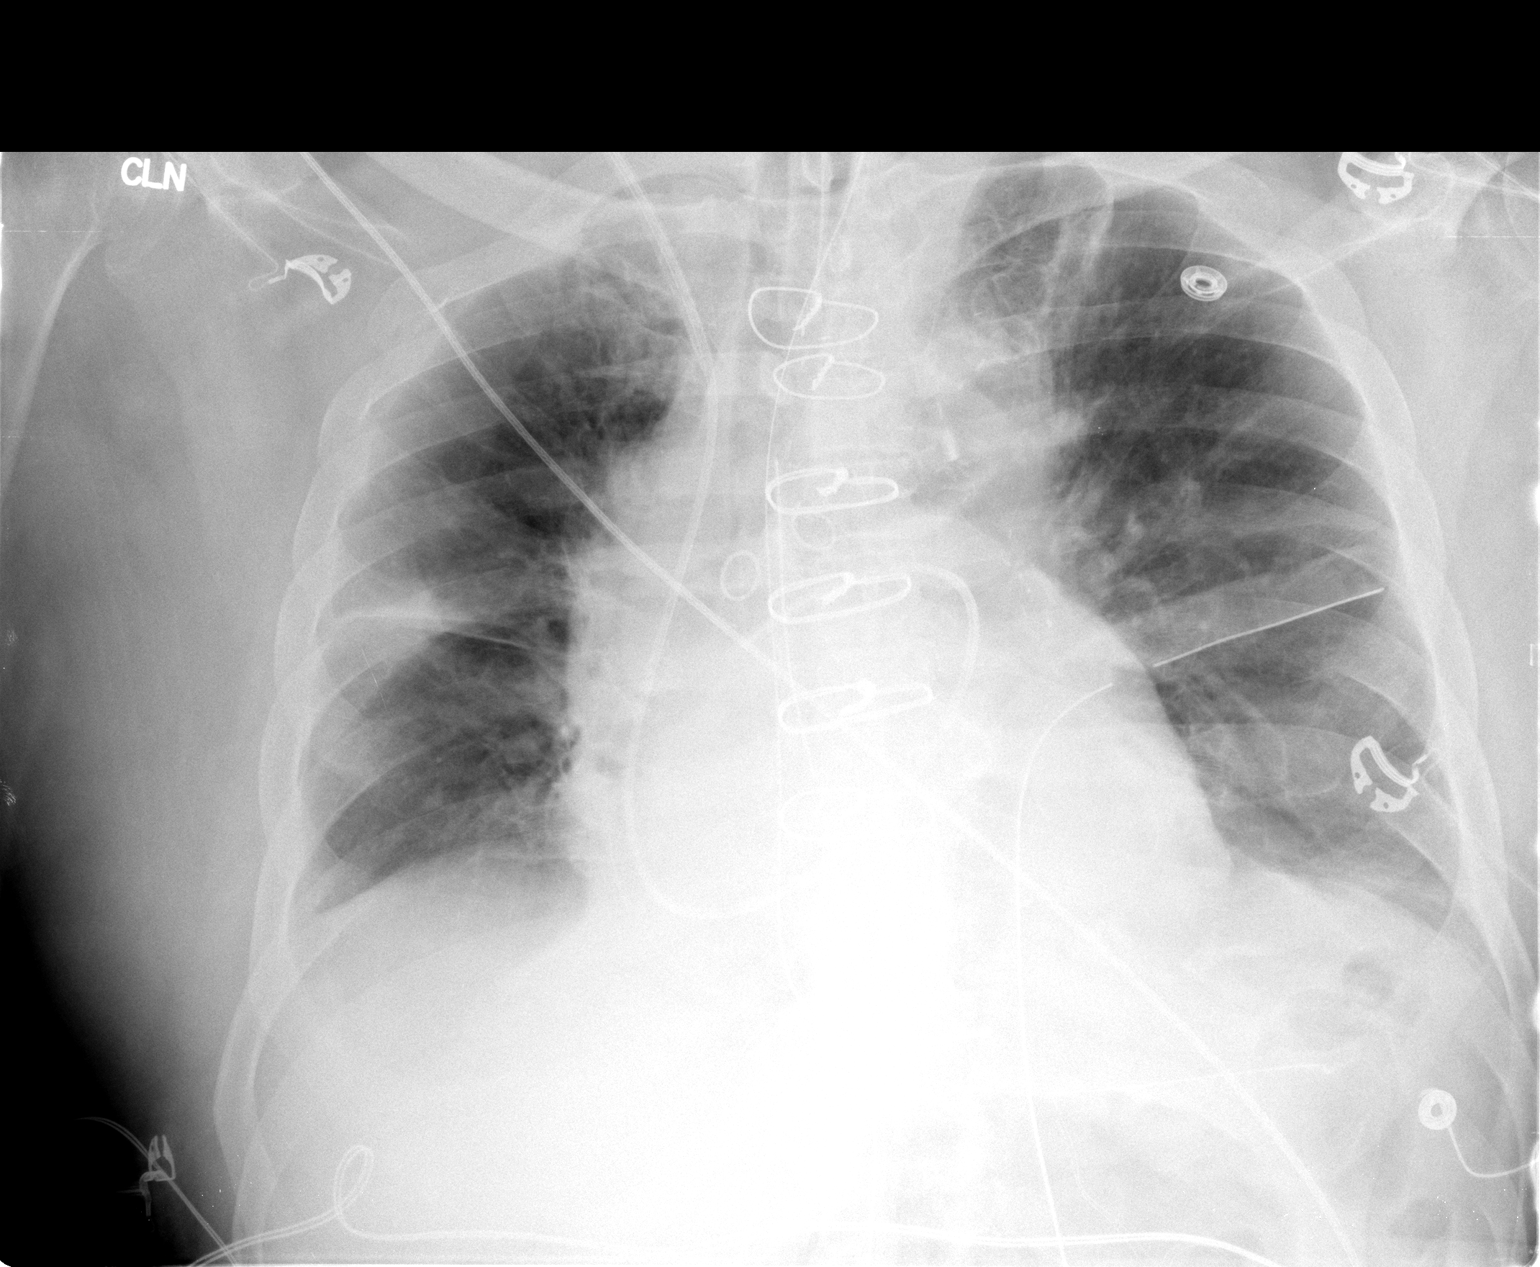

[1 of 1 positions shown; findings below may reference images not displayed]

FINDINGS: Tip of endotracheal tube 5.4 cm above carina.
Nasogastric tube extends into stomach.
Right jugular Swan-Ganz catheter, tip right pulmonary artery hilum.
Mediastinal drain and left thoracostomy tube unchanged.
Enlarged cardiac silhouette and mediastinum status post CABG.
Suspect intra-aortic balloon pump tip at mid to inferior aortic
arch.
Bibasilar atelectasis.
No definite pneumothorax.
IMPRESSION: Line and tube positions as above.
Bibasilar atelectasis.

## 2011-02-25 LAB — COMPREHENSIVE METABOLIC PANEL
Albumin: 3.5
Alkaline Phosphatase: 83
BUN: 22
Chloride: 105
Glucose, Bld: 90
Potassium: 3.7
Total Bilirubin: 0.8

## 2011-02-25 LAB — DIFFERENTIAL
Basophils Absolute: 0
Basophils Relative: 0
Monocytes Absolute: 0.4
Neutro Abs: 3.1
Neutrophils Relative %: 54

## 2011-02-25 LAB — CBC
HCT: 43.3
Hemoglobin: 14.2
WBC: 5.8

## 2011-03-05 IMAGING — CR DG ABD PORTABLE 1V
1 series · 1 of 1 positions shown · non-contrast
Comparison: 1037 hours the same day.

CLINICAL DATA: 84-year-old male with feeding tube placement.

ABDOMEN - 1 VIEW

[AP]
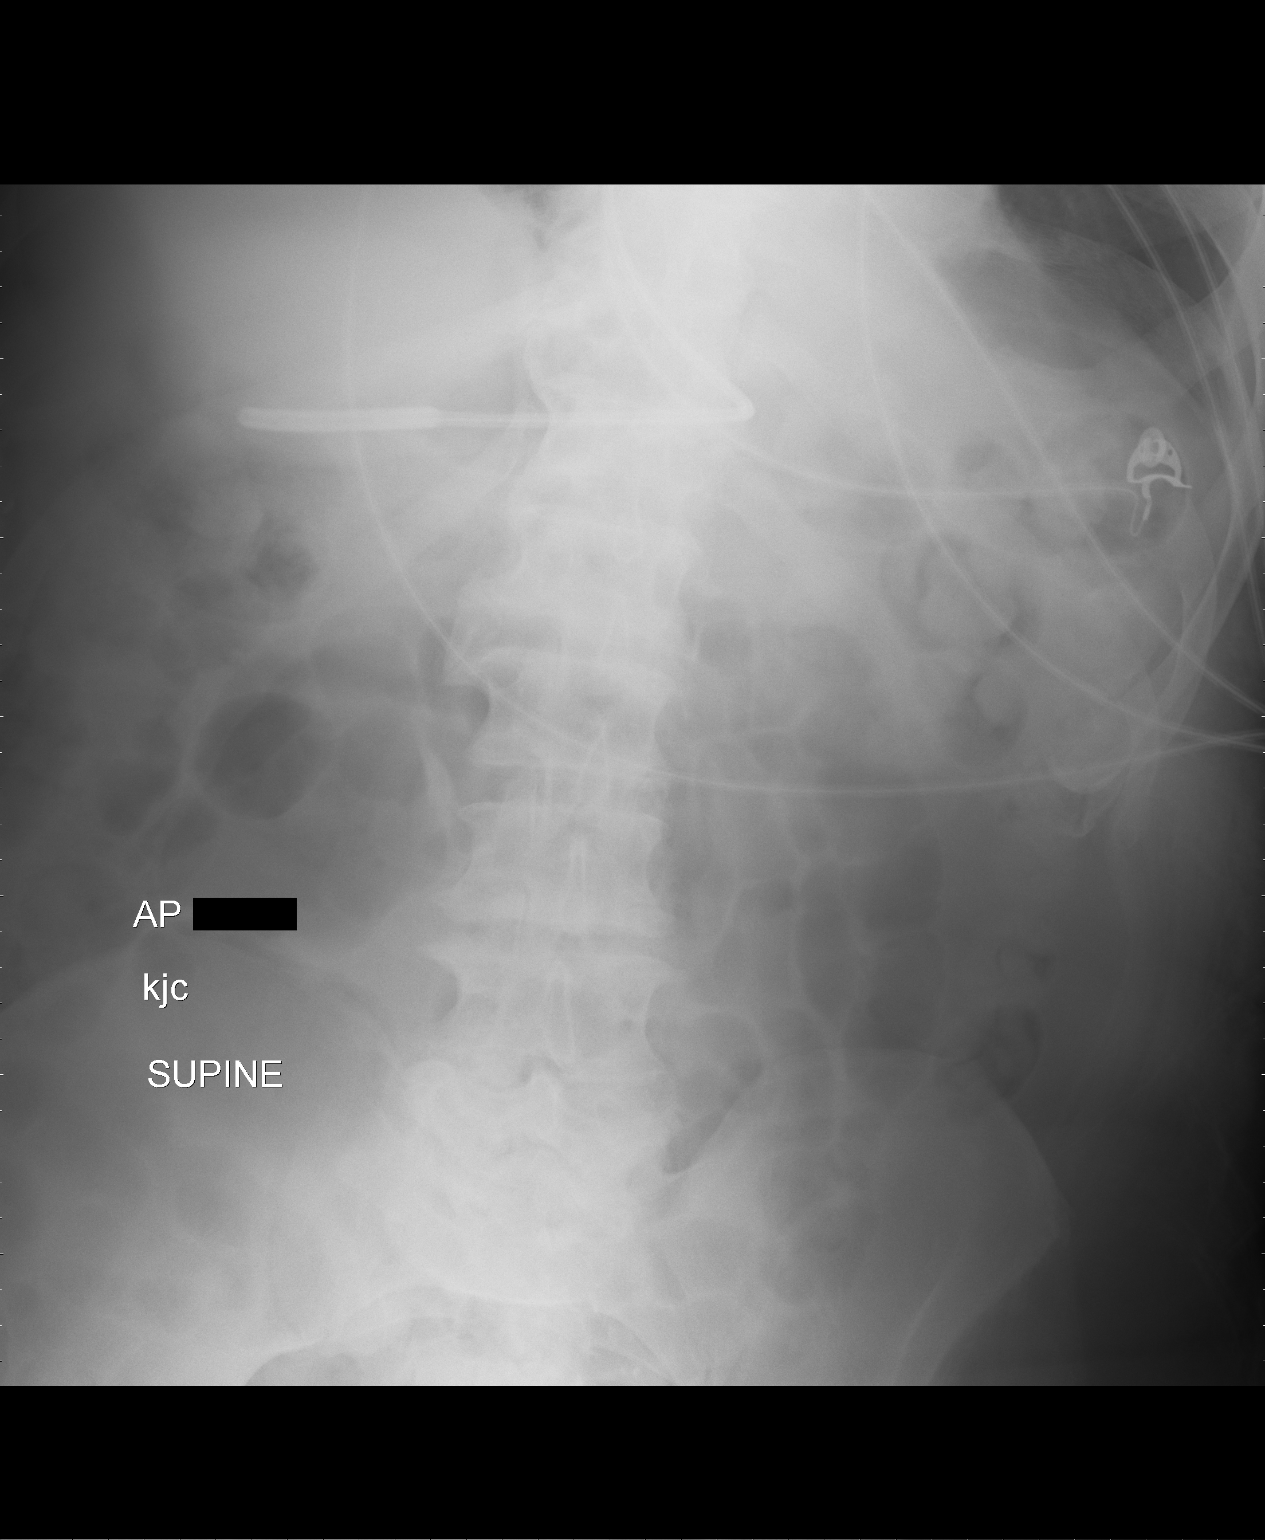

[1 of 1 positions shown; findings below may reference images not displayed]

FINDINGS: Portable supine AP view 1418 hours. Feeding tube tip in
the region of the duodenal bulb.
IMPRESSION: Feeding tube tip in the region of the duodenal bulb.

## 2011-03-11 LAB — URINALYSIS, ROUTINE W REFLEX MICROSCOPIC
Bilirubin Urine: NEGATIVE
Glucose, UA: NEGATIVE
Ketones, ur: NEGATIVE
Protein, ur: NEGATIVE

## 2011-03-11 LAB — COMPREHENSIVE METABOLIC PANEL
BUN: 16
CO2: 27
Calcium: 11 — ABNORMAL HIGH
Creatinine, Ser: 1.11
GFR calc non Af Amer: >60
Glucose, Bld: 101 — ABNORMAL HIGH
Total Bilirubin: 0.8

## 2011-03-11 LAB — CBC
HCT: 42.4
Hemoglobin: 13.7
MCHC: 32.4
MCV: 88.1
RBC: 4.81

## 2011-03-11 LAB — DIFFERENTIAL
Basophils Absolute: 0
Lymphocytes Relative: 24
Lymphs Abs: 1.6
Neutrophils Relative %: 71

## 2016-12-30 ENCOUNTER — Ambulatory Visit: Payer: Self-pay | Admitting: *Deleted

## 2017-03-24 ENCOUNTER — Ambulatory Visit: Payer: Self-pay | Admitting: *Deleted

## 2017-09-05 ENCOUNTER — Encounter: Payer: Self-pay | Admitting: *Deleted

## 2017-09-05 NOTE — Progress Notes (Signed)
This encounter was created in error - please disregard.
